# Patient Record
Sex: Female | Born: 1995 | Race: Black or African American | Hispanic: No | Marital: Single | Smoking: Former smoker
Health system: Southern US, Community
[De-identification: ages and names within clinical notes are randomized; demographics above are authoritative.]

## PROBLEM LIST (undated history)

## (undated) DIAGNOSIS — R809 Proteinuria, unspecified: Secondary | ICD-10-CM

## (undated) HISTORY — DX: Proteinuria, unspecified: R80.9

---
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
## 2014-02-16 ENCOUNTER — Telehealth: Payer: Self-pay

## 2014-02-16 NOTE — Telephone Encounter (Signed)
Patient not seen since 2013.  Pt needs physical.  Please advise.  Thanks!

## 2014-02-16 NOTE — Telephone Encounter (Signed)
If easier can do OV  First  Prefer 2 slots  visit

## 2014-02-16 NOTE — Telephone Encounter (Signed)
Pt called would like to discuss getting on birth control phone number [phone]

## 2014-02-18 NOTE — Telephone Encounter (Signed)
LM for pt to return call 02/18/14

## 2014-02-18 NOTE — Telephone Encounter (Signed)
Pt scheduled 3/30 at 11 am per md request.

## 2014-02-23 ENCOUNTER — Ambulatory Visit (INDEPENDENT_AMBULATORY_CARE_PROVIDER_SITE_OTHER): Payer: BC Managed Care – PPO | Admitting: Internal Medicine

## 2014-02-23 ENCOUNTER — Encounter: Payer: Self-pay | Admitting: Internal Medicine

## 2014-02-23 VITALS — BP 118/70 | Temp 99.0°F | Ht 68.25 in | Wt 155.0 lb

## 2014-02-23 DIAGNOSIS — Z3009 Encounter for other general counseling and advice on contraception: Secondary | ICD-10-CM

## 2014-02-23 DIAGNOSIS — Z30013 Encounter for initial prescription of injectable contraceptive: Secondary | ICD-10-CM

## 2014-02-23 DIAGNOSIS — Z113 Encounter for screening for infections with a predominantly sexual mode of transmission: Secondary | ICD-10-CM

## 2014-02-23 DIAGNOSIS — N926 Irregular menstruation, unspecified: Secondary | ICD-10-CM

## 2014-02-23 NOTE — Patient Instructions (Signed)
Call when have next period to come in for depo injection.( nursing visit injection only)  Urine screen today.   lifestyle intervention healthy eating and exercise . 150 minutes of exercise weeks  , . Avoid trans fats and processed foods;  Increase fresh fruits and veges to 5 servings per day. And avoid sweet beverages  Including tea and juice. Remain tobacco free.

## 2014-02-23 NOTE — Progress Notes (Signed)
Pre visit review using our clinic review tool, if applicable. No additional management support is needed unless otherwise documented below in the visit note.   Chief Complaint  Patient presents with  . Contraception    Would like to start bc for the first time.    HPI: Patient comes in today interested in contraception counseling and implementation. She is generally been well using condoms periods ocass irreg.  12 or more. A year interested in Depo-Provera because doesn't think she's going to be able to remember to take combined OCPs. No unusual vaginal symptoms or gynecologic symptoms. No tobacco  Neg asthma. Or significant migraines. GM had breast cancer older.  Negative family history of blood clots One partner the last 6 months has had HIV testing screening negative ROS: See pertinent positives and negatives per HPI. No chest pain shortness of breath asthma coughing major changes in vision hearing  Past Medical History  Diagnosis Date  . Proteinuria 2001    by DS. f/u 24 hr urine WNL    Family History  Problem Relation Age of Onset  . Hypertension Brother     History   Social History  . Marital Status: Married    Spouse Name: N/A    Number of Children: N/A  . Years of Education: N/A   Social History Main Topics  . Smoking status: Never Smoker   . Smokeless tobacco: None  . Alcohol Use: No  . Drug Use: No  . Sexual Activity:    Other Topics Concern  . None   Social History Narrative   Parents are divorced   [employer]   Average grades    Father in [employer]   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   Living with brother his girlfriend and 2 young children works weekends                   Outpatient Encounter Prescriptions as of 02/23/2014  Medication Sig  . [DISCONTINUED] diphenhydrAMINE (BENADRYL ALLERGY) 25 mg capsule Take 25 mg by mouth every 6 (six) hours as needed.    EXAM:  BP 118/70  Temp(Src) 99 F (37.2 C) (Oral)  Ht 5' 8.25" (1.734  m)  Wt 155 lb (70.308 kg)  BMI 23.38 kg/m2  LMP 02/14/2014  Body mass index is 23.38 kg/(m^2).  GENERAL: vitals reviewed and listed above, alert, oriented, appears well hydrated and in no acute distress HEENT: atraumatic, conjunctiva  clear, no obvious abnormalities on inspection of external nose and ears EMS clear OP : no lesion edema or exudate  NECK: no obvious masses on inspection palpation  Breast exam no masses or discharge bilateral pierced nipples LUNGS: clear to auscultation bilaterally, no wheezes, rales or rhonchi, good air movement CV: HRRR, no clubbing cyanosis or  peripheral edema nl cap refill  Abdomen:  Sof,t normal bowel sounds without hepatosplenomegaly, no guarding rebound or masses no CVA tenderness MS: moves all extremities without noticeable focal  abnormality PSYCH: pleasant and cooperative, no obvious depression or anxiety  ASSESSMENT AND PLAN:  Discussed the following assessment and plan:  Irregular menstrual cycle  Encounter for initial prescription of injectable contraceptive - option sdisc acceptable candidate   Screening for STD (sexually transmitted disease) - Plan: GC/chlamydia probe amp, urine  -Patient advised to return or notify health care team  if symptoms worsen ,persist or new concerns arise.  Patient Instructions  Call when have next period to come in for depo injection.(  nursing visit injection only)  Urine screen today.   lifestyle intervention healthy eating and exercise . 150 minutes of exercise weeks  , . Avoid trans fats and processed foods;  Increase fresh fruits and veges to 5 servings per day. And avoid sweet beverages  Including tea and juice. Remain tobacco free.      [name]. Panosh M.D.  Total visit 25mins > 50% spent counseling and coordinating care

## 2014-02-24 LAB — GC/CHLAMYDIA PROBE AMP, URINE
CHLAMYDIA, SWAB/URINE, PCR: POSITIVE — AB
GC PROBE AMP, URINE: NEGATIVE

## 2014-03-02 ENCOUNTER — Other Ambulatory Visit: Payer: Self-pay | Admitting: Family Medicine

## 2014-03-02 ENCOUNTER — Telehealth: Payer: Self-pay | Admitting: Family Medicine

## 2014-03-02 MED ORDER — AZITHROMYCIN 500 MG PO TABS: 1000.0000 mg | ORAL_TABLET | Freq: Every day | ORAL | Status: DC

## 2014-03-02 NOTE — Telephone Encounter (Signed)
Lab work closed out.  Called the pt and gave her the results of lab work from 02/23/14.  Medication sent to the pharmacy and she made future lab appt on 03/04/14. Instructed to call back if any further questions.

## 2014-03-04 ENCOUNTER — Other Ambulatory Visit: Payer: BC Managed Care – PPO

## 2014-03-11 ENCOUNTER — Other Ambulatory Visit (INDEPENDENT_AMBULATORY_CARE_PROVIDER_SITE_OTHER): Payer: BC Managed Care – PPO

## 2014-03-11 DIAGNOSIS — Z113 Encounter for screening for infections with a predominantly sexual mode of transmission: Secondary | ICD-10-CM

## 2014-03-11 LAB — CBC WITH DIFFERENTIAL/PLATELET
BASOS ABS: 0 10*3/uL (ref 0.0–0.1)
BASOS PCT: 0.7 % (ref 0.0–3.0)
EOS ABS: 0.1 10*3/uL (ref 0.0–0.7)
Eosinophils Relative: 1.2 % (ref 0.0–5.0)
HCT: 34.9 % — ABNORMAL LOW (ref 36.0–46.0)
Hemoglobin: 11.4 g/dL — ABNORMAL LOW (ref 12.0–15.0)
LYMPHS PCT: 38.8 % (ref 12.0–46.0)
Lymphs Abs: 2.6 10*3/uL (ref 0.7–4.0)
MCHC: 32.7 g/dL (ref 30.0–36.0)
MCV: 96.7 fl (ref 78.0–100.0)
Monocytes Absolute: 0.5 10*3/uL (ref 0.1–1.0)
Monocytes Relative: 6.9 % (ref 3.0–12.0)
NEUTROS PCT: 52.4 % (ref 43.0–77.0)
Neutro Abs: 3.6 10*3/uL (ref 1.4–7.7)
Platelets: 252 10*3/uL (ref 150.0–400.0)
RBC: 3.61 Mil/uL — AB (ref 3.87–5.11)
RDW: 12.9 % (ref 11.5–14.6)
WBC: 6.8 10*3/uL (ref 4.5–10.5)

## 2014-03-12 LAB — HIV ANTIBODY (ROUTINE TESTING W REFLEX): HIV 1&2 Ab, 4th Generation: NONREACTIVE

## 2014-03-12 LAB — RPR

## 2014-03-29 ENCOUNTER — Ambulatory Visit (INDEPENDENT_AMBULATORY_CARE_PROVIDER_SITE_OTHER): Payer: BC Managed Care – PPO | Admitting: Family Medicine

## 2014-03-29 DIAGNOSIS — Z309 Encounter for contraceptive management, unspecified: Secondary | ICD-10-CM

## 2014-03-29 LAB — POCT URINE PREGNANCY: PREG TEST UR: NEGATIVE

## 2014-03-29 MED ORDER — MEDROXYPROGESTERONE ACETATE 150 MG/ML IM SUSP
150.0000 mg | Freq: Once | INTRAMUSCULAR | Status: AC
  Administered 2014-03-29: 150 mg via INTRAMUSCULAR

## 2014-03-29 NOTE — Progress Notes (Deleted)
   Subjective:    Patient ID: Megan Rubio, female    DOB: 11/06/1996, 18 y.o.   MRN: [id number]  HPI    Review of Systems     Objective:   Physical Exam        Assessment & Plan:

## 2014-04-29 ENCOUNTER — Telehealth: Payer: Self-pay | Admitting: Internal Medicine

## 2014-04-29 NOTE — Telephone Encounter (Signed)
Noted  

## 2014-04-29 NOTE — Telephone Encounter (Signed)
Patient Information:  Caller Name: Assia  Phone: [phone]  Patient: Megan Rubio, Megan Rubio  Gender: Female  DOB: 06-14-1996  Age: 18 Years  PCP: Berniece Andreas [facility])  Pregnant: No  Office Follow Up:  Does the office need to follow up with this patient?: No  Instructions For The Office: N/A  RN Note:  LMP 04/21/14. First depoprovera shot was 03/29/14. Current flow is mild on cycle day #8. Plans to call back to schedule appointment after checks work schedule.  Symptoms  Reason For Call & Symptoms: Menses lasting longer than usual; so far 7 days.  Usual menses lasts 3-4 days.  Also noted menses blood is darker brown color. Mild menstrual cramps present.  Reviewed Health History In EMR: Yes  Reviewed Medications In EMR: Yes  Reviewed Allergies In EMR: Yes  Reviewed Surgeries / Procedures: Yes  Date of Onset of Symptoms: 04/28/2014 OB / GYN:  LMP: 04/21/2014  Guideline(s) Used:  Vaginal Bleeding - Abnormal  Disposition Per Guideline:   See Within 2 Weeks in Office  Reason For Disposition Reached:   Periods last > 7 days  Advice Given:  Pregnancy Test, When in Doubt:  If there is a chance that you might be pregnant, use a urine pregnancy test.  You can buy a pregnancy test at the drugstore.  It works best if you test your first urine in the morning.  Call back if you are pregnant  Iron and Anemia:   Heavy periods are the most common cause of iron deficiency anemia in women of childbearing age. Women with heavy periods should eat a diet rich in iron or take a daily multivitamin pill with iron.  Irregular bleeding and You are Using Implanon or DepoProvera:   Irregular bleeding is a common side effect. It may include heavier, lighter, more frequent, or less frequent bleeding than your normal periods.  Diary  : Keep a record of the days you have any bleeding or spotting.  Call Back If:  Irregular bleeding occurs more than 2 cycles (2 months)  Bleeding becomes worse  You  become worse.  RN Overrode Recommendation:  Follow Up With Office Later  Prefers to call back to schedule appointment after checks work schedule

## 2014-05-04 ENCOUNTER — Ambulatory Visit (INDEPENDENT_AMBULATORY_CARE_PROVIDER_SITE_OTHER): Payer: BC Managed Care – PPO | Admitting: Internal Medicine

## 2014-05-04 ENCOUNTER — Encounter: Payer: Self-pay | Admitting: Internal Medicine

## 2014-05-04 VITALS — BP 120/60 | Temp 99.4°F | Ht 68.25 in | Wt 154.0 lb

## 2014-05-04 DIAGNOSIS — N926 Irregular menstruation, unspecified: Secondary | ICD-10-CM

## 2014-05-04 DIAGNOSIS — N921 Excessive and frequent menstruation with irregular cycle: Secondary | ICD-10-CM

## 2014-05-04 DIAGNOSIS — N920 Excessive and frequent menstruation with regular cycle: Secondary | ICD-10-CM

## 2014-05-04 LAB — POCT URINE PREGNANCY: PREG TEST UR: NEGATIVE

## 2014-05-04 NOTE — Progress Notes (Signed)
Chief Complaint  Patient presents with  . Abnormal Bleeding    Pt started Depo Provera in May.    HPI: Patient comes in today for SDA for  problem evaluation. Concern about bleeding . normaly 12-2 periods per month last 3 days or so began first depo shot on  MAy 5th  Had chlamydia rx in early April on screen   Pos rx with azithro and neg blood test  ( partner was rx per pt) Since that time : Bleeding started MAy 26 light spotting and dark  And now more red and more.  Using pads now about 3-4 per day minor cramps.  Feels normal otherwise.  Uncertain if this is normal     No cp sob syncope other bleeding ROS: See pertinent positives and negatives per HPI.no  Past Medical History  Diagnosis Date  . Proteinuria 2001    by DS. f/u 24 hr urine WNL    Family History  Problem Relation Age of Onset  . Hypertension Brother     History   Social History  . Marital Status: Married    Spouse Name: N/A    Number of Children: N/A  . Years of Education: N/A   Social History Main Topics  . Smoking status: Never Smoker   . Smokeless tobacco: None  . Alcohol Use: No  . Drug Use: No  . Sexual Activity:    Other Topics Concern  . 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     Neg tad.   Living with brother his girlfriend and 2 young children works weekends                   Outpatient Encounter Prescriptions as of 05/04/2014  Medication Sig  . [DISCONTINUED] azithromycin (ZITHROMAX) 500 MG tablet Take 2 tablets (1,000 mg total) by mouth daily.    EXAM:  BP 120/60  Temp(Src) 99.4 F (37.4 C) (Oral)  Ht 5' 8.25" (1.734 m)  Wt 154 lb (69.854 kg)  BMI 23.23 kg/m2  Body mass index is 23.23 kg/(m^2).  GENERAL: vitals reviewed and listed above, alert, oriented, appears well hydrated and in no acute distress HEENT: atraumatic, conjunctiva  clear, no obvious abnormalities on  inspection of external nose and ears NECK: no obvious masses on inspection palpation  LUNGS: clear to auscultation bilaterally, no wheezes, rales or rhonchi, good air movement CV: HRRR, no clubbing cyanosis or  peripheral edema nl cap refill  Abdomen:  Sof,t normal bowel sounds without hepatosplenomegaly, no guarding rebound or masses no CVA tenderness Skin: normal capillary refill ,turgor , color: No acute rashes ,petechiae or bruising MS: moves all extremities without noticeable focal  abnormality PSYCH: pleasant and cooperative, no obvious depression or anxiety  ASSESSMENT AND PLAN:  Discussed the following assessment and plan:  Irregular menstrual cycle - Plan: GC/chlamydia probe amp, urine, POCT urine pregnancy  Prolonged menstrual cycle - Plan: GC/chlamydia probe amp, urine, POCT urine pregnancy Hx of chlamydia on screen rx  Recheck today ( doubt) bleeding pattern prob from transition to depo  But follow  .   Expectant management. And return for alarm feature etc .  -Patient advised to return or notify health care team  if symptoms worsen ,persist or new concerns arise. Sign up for My Chart. Patient Instructions  This is probably transitional bleeding  from beginning the dep but would check urine for pregnancy( doubt ) and rescreen for chlamydia  To be sure no infection is causing the problem .  Track bleeding  And come back for next depo shot . Should get shorter and lighter bleeding with time.    Neta Mends. Demarus Latterell M.D.   Pre visit review using our clinic review tool, if applicable. No additional management support is needed unless otherwise documented below in the visit note.

## 2014-05-04 NOTE — Patient Instructions (Signed)
This is probably transitional bleeding from beginning the dep but would check urine for pregnancy( doubt ) and rescreen for chlamydia  To be sure no infection is causing the problem .  Track bleeding  And come back for next depo shot . Should get shorter and lighter bleeding with time.

## 2014-05-05 LAB — GC/CHLAMYDIA PROBE AMP, URINE
Chlamydia, Swab/Urine, PCR: NEGATIVE
GC Probe Amp, Urine: NEGATIVE

## 2014-06-14 ENCOUNTER — Ambulatory Visit (INDEPENDENT_AMBULATORY_CARE_PROVIDER_SITE_OTHER): Payer: BC Managed Care – PPO | Admitting: Family Medicine

## 2014-06-14 ENCOUNTER — Ambulatory Visit: Payer: BC Managed Care – PPO | Admitting: Family Medicine

## 2014-06-14 DIAGNOSIS — Z309 Encounter for contraceptive management, unspecified: Secondary | ICD-10-CM

## 2014-06-14 MED ORDER — MEDROXYPROGESTERONE ACETATE 150 MG/ML IM SUSP
150.0000 mg | Freq: Once | INTRAMUSCULAR | Status: AC
  Administered 2014-06-14: 150 mg via INTRAMUSCULAR

## 2014-08-18 ENCOUNTER — Telehealth: Payer: Self-pay | Admitting: Internal Medicine

## 2014-08-18 NOTE — Telephone Encounter (Signed)
Pt would  Like to switch to the pill. Pt will get her depo on Monday 9/28, but wants to know what she needs to do to make this happen?

## 2014-08-23 ENCOUNTER — Ambulatory Visit: Payer: BC Managed Care – PPO | Admitting: Family Medicine

## 2014-08-23 MED ORDER — NORETHINDRONE ACET-ETHINYL EST 1.5-30 MG-MCG PO TABS: 1.0000 | ORAL_TABLET | Freq: Every day | ORAL | Status: DC

## 2014-08-23 NOTE — Telephone Encounter (Signed)
Can begin ocp  Today  Instead of depo shot   May take 2-3 months to get a regular bleeding pattern. Loestrin 1/30  28 day pack 1 po qd refill x 5 . rov  After 3 packs  Track bleeding pattern

## 2014-08-23 NOTE — Telephone Encounter (Signed)
Pt given rx for bc pills.  Notified to come back to the office for follow up in 3 months.  Will call back if any problems or unable to establish a regular bleeding pattern.

## 2014-09-25 ENCOUNTER — Ambulatory Visit (INDEPENDENT_AMBULATORY_CARE_PROVIDER_SITE_OTHER): Payer: BC Managed Care – PPO | Admitting: Family Medicine

## 2014-09-25 ENCOUNTER — Encounter: Payer: Self-pay | Admitting: Family Medicine

## 2014-09-25 VITALS — BP 108/80 | HR 72 | Temp 98.9°F | Ht 68.25 in | Wt 142.1 lb

## 2014-09-25 DIAGNOSIS — J020 Streptococcal pharyngitis: Secondary | ICD-10-CM

## 2014-09-25 DIAGNOSIS — J028 Acute pharyngitis due to other specified organisms: Secondary | ICD-10-CM

## 2014-09-25 LAB — POCT RAPID STREP A (OFFICE): Rapid Strep A Screen: NEGATIVE

## 2014-09-25 NOTE — Progress Notes (Signed)
Patient ID: Megan Rubio Gammage, female   DOB: 05/09/1996, 18 y.o.   MRN: [id number] Megan Rubio Bigos [id number] 01/06/1996 09/25/2014      Progress Note-Follow Up  Subjective  Chief Complaint  Chief Complaint  Patient presents with  . Sore Throat    possible strep X 2 days- starting to feel better today  . Headache    X 2 days    HPI  Patient is a 18 year old female in today for routine medical care. 2 day history of sore throat, responding to chloraseptic but then improved. Some llow grade fevers, mild headache and malaise noted. Denies CP/palp/SOB/congestion/GI or GU c/o. Taking meds as prescribed  Past Medical History  Diagnosis Date  . Proteinuria 2001    by DS. f/u 24 hr urine WNL    History reviewed. No pertinent past surgical history.  Family History  Problem Relation Age of Onset  . Hypertension Brother     History   Social History  . Marital Status: Married    Spouse Name: N/A    Number of Children: N/A  . Years of Education: N/A   Occupational History  . Not on file.   Social History Main Topics  . Smoking status: Never Smoker   . Smokeless tobacco: Not on file  . Alcohol Use: No  . Drug Use: No  . Sexual Activity:    Other Topics Concern  . 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   Living with brother his girlfriend and 2 young children works weekends                   Current Outpatient Prescriptions on File Prior to Visit  Medication Sig Dispense Refill  . Norethindrone Acetate-Ethinyl Estradiol (JUNEL,LOESTRIN,MICROGESTIN) 1.5-30 MG-MCG tablet Take 1 tablet by mouth daily.  1 Package  5   No current facility-administered medications on file prior to visit.    Allergies  Allergen Reactions  . Sulfa Antibiotics     Per pt SOB and dizziness     Review of Systems  Review of Systems  Constitutional: Positive for  fever. Negative for malaise/fatigue.  HENT: Positive for sore throat. Negative for congestion.   Eyes: Negative for discharge.  Respiratory: Negative for shortness of breath.   Cardiovascular: Negative for chest pain, palpitations and leg swelling.  Gastrointestinal: Negative for nausea, abdominal pain and diarrhea.  Genitourinary: Negative for dysuria.  Musculoskeletal: Positive for myalgias.  Skin: Negative for rash.  Neurological: Positive for headaches. Negative for loss of consciousness.  Endo/Heme/Allergies: Negative for polydipsia.    Objective  BP 108/80  Pulse 72  Temp(Src) 98.9 F (37.2 C) (Oral)  Ht 5' 8.25" (1.734 Rubio)  Wt 142 lb 1.9 oz (64.465 kg)  BMI 21.44 kg/m2  SpO2 99%  Physical Exam  Physical Exam  Constitutional: She is oriented to person, place, and time and well-developed, well-nourished, and in no distress. No distress.  HENT:  Head: Normocephalic and atraumatic.  2+ tonsil b/l. Erythema with white patch on left.   Eyes: Conjunctivae are normal.  Neck: Neck supple. No thyromegaly present.  Cardiovascular: Normal rate, regular rhythm and normal heart sounds.   No murmur heard. Pulmonary/Chest: Effort normal and breath sounds normal. She has no wheezes.  Abdominal: She exhibits no distension and  no mass.  Musculoskeletal: She exhibits no edema.  Lymphadenopathy:    She has cervical adenopathy.  Neurological: She is alert and oriented to person, place, and time.  Skin: Skin is warm and dry. No rash noted. She is not diaphoretic.  Psychiatric: Memory, affect and judgment normal.    Lab Results  Component Value Date   TSH 0.42 10/27/2010   Lab Results  Component Value Date   WBC 6.8 03/11/2014   HGB 11.4* 03/11/2014   HCT 34.9* 03/11/2014   MCV 96.7 03/11/2014   PLT 252.0 03/11/2014   Lab Results  Component Value Date   CREATININE 0.6 10/27/2010   BUN 10 10/27/2010   NA 138 10/27/2010   K 4.2 10/27/2010   CL 105 10/27/2010   CO2 24 10/27/2010    Lab Results  Component Value Date   ALT 10 10/27/2010   AST 21 10/27/2010   ALKPHOS 116 10/27/2010   BILITOT 1.1 10/27/2010      Assessment & Plan  Pharyngitis, acute Rapid strep negative. Treat viral symptoms Encouraged increased rest and hydration, add probiotics, zinc such as Coldeze or Xicam. Treat fevers as needed and call if worsens

## 2014-09-25 NOTE — Patient Instructions (Signed)
Encouraged increased rest and hydration, add probiotics such as Digestive Advantage or Vear Clock[employer]illips colon health, zinc such as Coldeze or Xicam. Treat fevers as needed Call if worsens  Pharyngitis Pharyngitis is redness, pain, and swelling (inflammation) of your pharynx.  CAUSES   Pharyngitis is usually caused by infection. Most of the time, these infections are from viruses (viral) and are part of a cold. However, sometimes pharyngitis is caused by bacteria (bacterial). Pharyngitis can also be caused by allergies. Viral pharyngitis may be spread from person to person by coughing, sneezing, and personal items or utensils (cups, forks, spoons, toothbrushes). Bacterial pharyngitis may be spread from person to person by more intimate contact, such as kissing.  SIGNS AND SYMPTOMS  Symptoms of pharyngitis include:   Sore throat.   Tiredness (fatigue).   Low-grade fever.   Headache.  Joint pain and muscle aches.  Skin rashes.  Swollen lymph nodes.  Plaque-like film on throat or tonsils (often seen with bacterial pharyngitis). DIAGNOSIS  Your health care provider will ask you questions about your illness and your symptoms. Your medical history, along with a physical exam, is often all that is needed to diagnose pharyngitis. Sometimes, a rapid strep test is done. Other lab tests may also be done, depending on the suspected cause.  TREATMENT  Viral pharyngitis will usually get better in 3-4 days without the use of medicine. Bacterial pharyngitis is treated with medicines that kill germs (antibiotics).  HOME CARE INSTRUCTIONS   Drink enough water and fluids to keep your urine clear or pale yellow.   Only take over-the-counter or prescription medicines as directed by your health care provider:   If you are prescribed antibiotics, make sure you finish them even if you start to feel better.   Do not take aspirin.   Get lots of rest.   Gargle with 8 oz of salt water ( tsp of salt per  1 qt of water) as often as every 1-2 hours to soothe your throat.   Throat lozenges (if you are not at risk for choking) or sprays may be used to soothe your throat. SEEK MEDICAL CARE IF:   You have large, tender lumps in your neck.  You have a rash.  You cough up green, yellow-brown, or bloody spit. SEEK IMMEDIATE MEDICAL CARE IF:   Your neck becomes stiff.  You drool or are unable to swallow liquids.  You vomit or are unable to keep medicines or liquids down.  You have severe pain that does not go away with the use of recommended medicines.  You have trouble breathing (not caused by a stuffy nose). MAKE SURE YOU:   Understand these instructions.  Will watch your condition.  Will get help right away if you are not doing well or get worse. Document Released: 11/12/2005 Document Revised: 09/02/2013 Document Reviewed: 07/20/2013 [facility] Patient Information 2015 [place], [place]. This information is not intended to replace advice given to you by your health care provider. Make sure you discuss any questions you have with your health care provider.

## 2014-09-25 NOTE — Assessment & Plan Note (Signed)
Rapid strep negative. Treat viral symptoms Encouraged increased rest and hydration, add probiotics, zinc such as Coldeze or Xicam. Treat fevers as needed and call if worsens

## 2014-10-31 ENCOUNTER — Emergency Department (HOSPITAL_COMMUNITY): Payer: BC Managed Care – PPO

## 2014-10-31 ENCOUNTER — Encounter (HOSPITAL_COMMUNITY): Payer: Self-pay | Admitting: Emergency Medicine

## 2014-10-31 ENCOUNTER — Emergency Department (HOSPITAL_COMMUNITY)
Admission: EM | Admit: 2014-10-31 | Discharge: 2014-10-31 | Disposition: A | Discharge status: Home / Self Care | Payer: BC Managed Care – PPO | Attending: Emergency Medicine | Admitting: Emergency Medicine

## 2014-10-31 DIAGNOSIS — R42 Dizziness and giddiness: Secondary | ICD-10-CM | POA: Insufficient documentation

## 2014-10-31 DIAGNOSIS — Z3202 Encounter for pregnancy test, result negative: Secondary | ICD-10-CM | POA: Diagnosis not present

## 2014-10-31 DIAGNOSIS — R51 Headache: Secondary | ICD-10-CM | POA: Diagnosis present

## 2014-10-31 DIAGNOSIS — Z79899 Other long term (current) drug therapy: Secondary | ICD-10-CM | POA: Insufficient documentation

## 2014-10-31 DIAGNOSIS — R519 Headache, unspecified: Secondary | ICD-10-CM

## 2014-10-31 DIAGNOSIS — M546 Pain in thoracic spine: Secondary | ICD-10-CM | POA: Insufficient documentation

## 2014-10-31 LAB — URINALYSIS, ROUTINE W REFLEX MICROSCOPIC
Bilirubin Urine: NEGATIVE
GLUCOSE, UA: NEGATIVE mg/dL
HGB URINE DIPSTICK: NEGATIVE
Ketones, ur: NEGATIVE mg/dL
Leukocytes, UA: NEGATIVE
Nitrite: NEGATIVE
Protein, ur: NEGATIVE mg/dL
Specific Gravity, Urine: 1.007 (ref 1.005–1.030)
Urobilinogen, UA: 1 mg/dL (ref 0.0–1.0)
pH: 6.5 (ref 5.0–8.0)

## 2014-10-31 LAB — POC URINE PREG, ED: Preg Test, Ur: NEGATIVE

## 2014-10-31 MED ORDER — DIPHENHYDRAMINE HCL 50 MG/ML IJ SOLN
25.0000 mg | Freq: Once | INTRAMUSCULAR | Status: AC
  Administered 2014-10-31: 25 mg via INTRAVENOUS
  Filled 2014-10-31: qty 1

## 2014-10-31 MED ORDER — DEXAMETHASONE SODIUM PHOSPHATE 10 MG/ML IJ SOLN
10.0000 mg | Freq: Once | INTRAMUSCULAR | Status: AC
  Administered 2014-10-31: 10 mg via INTRAVENOUS
  Filled 2014-10-31: qty 1

## 2014-10-31 MED ORDER — SODIUM CHLORIDE 0.9 % IV BOLUS (SEPSIS)
1000.0000 mL | INTRAVENOUS | Status: AC
  Administered 2014-10-31: 1000 mL via INTRAVENOUS

## 2014-10-31 MED ORDER — METOCLOPRAMIDE HCL 5 MG/ML IJ SOLN
5.0000 mg | Freq: Once | INTRAMUSCULAR | Status: AC
  Administered 2014-10-31: 5 mg via INTRAVENOUS
  Filled 2014-10-31: qty 2

## 2014-10-31 NOTE — ED Provider Notes (Signed)
CSN: [id number]     Arrival date & time 10/31/14  2142 History   First MD Initiated Contact with Patient 10/31/14 2155     Chief Complaint  Patient presents with  . Headache  . Back Pain     (Consider location/radiation/quality/duration/timing/severity/associated sxs/prior Treatment) Patient is a 18 y.o. female presenting with headaches and back pain. The history is provided by the patient.  Headache Pain location:  L temporal Quality:  Dull Radiates to:  L neck Severity currently:  7/10 Onset quality:  Sudden Duration:  12 hours Timing:  Constant Progression:  Unchanged Chronicity:  New Similar to prior headaches: slightly different.   Context comment:  At rest Relieved by:  Nothing Worsened by:  Nothing tried Ineffective treatments:  None tried Associated symptoms: back pain and dizziness   Associated symptoms: no abdominal pain, no congestion, no cough, no diarrhea, no pain, no fatigue, no fever, no nausea, no neck pain and no vomiting   Back Pain Associated symptoms: headaches   Associated symptoms: no abdominal pain, no chest pain, no dysuria and no fever     Past Medical History  Diagnosis Date  . Proteinuria 2001    by DS. f/u 24 hr urine WNL   History reviewed. No pertinent past surgical history. Family History  Problem Relation Age of Onset  . Hypertension Brother    History  Substance Use Topics  . Smoking status: Never Smoker   . Smokeless tobacco: Not on file  . Alcohol Use: No   OB History    No data available     Review of Systems  Constitutional: Negative for fever and fatigue.  HENT: Negative for congestion and drooling.   Eyes: Negative for pain.  Respiratory: Negative for cough and shortness of breath.   Cardiovascular: Negative for chest pain.  Gastrointestinal: Negative for nausea, vomiting, abdominal pain and diarrhea.  Genitourinary: Negative for dysuria and hematuria.  Musculoskeletal: Positive for back pain. Negative for gait  problem and neck pain.  Skin: Negative for color change.  Neurological: Positive for dizziness and headaches.  Hematological: Negative for adenopathy.  Psychiatric/Behavioral: Negative for behavioral problems.  All other systems reviewed and are negative.     Allergies  Sulfa antibiotics  Home Medications   Prior to Admission medications   Medication Sig Start Date End Date Taking? Authorizing Provider  Norethindrone Acetate-Ethinyl Estradiol (JUNEL,LOESTRIN,MICROGESTIN) 1.5-30 MG-MCG tablet Take 1 tablet by mouth daily. 08/23/14   Madelin HeadingsWanda K Panosh, MD   BP 136/90 mmHg  Pulse 83  Temp(Src) 98.3 F (36.8 C) (Oral)  Resp 18  Ht 5\' 8"  (1.727 m)  Wt 130 lb (58.968 kg)  BMI 19.77 kg/m2  SpO2 100%  LMP 10/10/2014 Physical Exam  Constitutional: She is oriented to person, place, and time. She appears well-developed and well-nourished.  HENT:  Head: Normocephalic.  Mouth/Throat: No oropharyngeal exudate.  Eyes: Conjunctivae and EOM are normal. Pupils are equal, round, and reactive to light.  Neck: Normal range of motion. Neck supple.  Normal range of motion of the neck.  Mild midthoracic tenderness to palpation.  Cardiovascular: Normal rate, regular rhythm, normal heart sounds and intact distal pulses.  Exam reveals no gallop and no friction rub.   No murmur heard. Pulmonary/Chest: Effort normal and breath sounds normal. No respiratory distress. She has no wheezes.  Abdominal: Soft. Bowel sounds are normal. There is no tenderness. There is no rebound and no guarding.  Musculoskeletal: Normal range of motion. She exhibits no edema or tenderness.  Neurological:  She is alert and oriented to person, place, and time.  alert, oriented x3 speech: normal in context and clarity memory: intact grossly cranial nerves II-XII: intact motor strength: full proximally and distally no involuntary movements or tremors sensation: intact to light touch diffusely  cerebellar: finger-to-nose and  heel-to-shin intact gait: normal forwards and backwards  Skin: Skin is warm and dry.  Psychiatric: She has a normal mood and affect. Her behavior is normal.  Nursing note and vitals reviewed.   ED Course  Procedures (including critical care time) Labs Review Labs Reviewed  URINALYSIS, ROUTINE W REFLEX MICROSCOPIC  POC URINE PREG, ED    Imaging Review Dg Thoracic Spine W/swimmers  10/31/2014   CLINICAL DATA:  Mid thoracic back pain and swelling, back pain worse with movement, symptoms began 1 week ago  EXAM: THORACIC SPINE - 2 VIEW + SWIMMERS  COMPARISON:  None  FINDINGS: Twelve pairs of ribs.  Broad based levoconvex thoracic scoliosis apex T6.  Vertebral body and disc space heights maintained.  Osseous mineralization grossly normal.  No acute fracture, subluxation or bone destruction.  Visualized posterior ribs unremarkable.  IMPRESSION: Broad-based levoconvex thoracic scoliosis.   Electronically Signed   By: Ulyses SouthwardMark  Boles M.D.   On: 10/31/2014 22:59     EKG Interpretation None      MDM   Final diagnoses:  Thoracic back pain  Headache, unspecified headache type    10:12 PM 18 y.o. female who presents with thoracic back pain which began approximately 1 week ago. She states that she developed a brisk onset left-sided headache which began this morning and has persisted throughout the day. Currently 7 out of 10. She denies any urinary symptoms. She is afebrile and vital signs are unremarkable here. She has a normal neurologic exam. We'll treat with headache cocktail and screening imaging of her back at request of the family.  11:20 PM: HA gone, pt continues to appear well.  I have discussed the diagnosis/risks/treatment options with the patient and family and believe the pt to be eligible for discharge home to follow-up with her pcp. We also discussed returning to the ED immediately if new or worsening sx occur. We discussed the sx which are most concerning (e.g., return of HA, fever,  worsening back pain) that necessitate immediate return. Medications administered to the patient during their visit and any new prescriptions provided to the patient are listed below.  Medications given during this visit Medications  sodium chloride 0.9 % bolus 1,000 mL (1,000 mLs Intravenous New Bag/Given 10/31/14 2229)  metoCLOPramide (REGLAN) injection 5 mg (5 mg Intravenous Given 10/31/14 2228)  diphenhydrAMINE (BENADRYL) injection 25 mg (25 mg Intravenous Given 10/31/14 2230)  dexamethasone (DECADRON) injection 10 mg (10 mg Intravenous Given 10/31/14 2229)    New Prescriptions   No medications on file     Purvis SheffieldForrest Rivan Siordia, MD 10/31/14 2335

## 2014-10-31 NOTE — ED Notes (Signed)
Pt reports noticing lower back pain and swelling while at work about a week ago, states she had swelling along the spine. Back pain worse with movement. Ambulatory with steady gait. Pt also c/o headache to L side of head onset yesterday along with dizziness/ lightheadedness. No blurred vision.

## 2015-01-28 ENCOUNTER — Telehealth: Payer: Self-pay | Admitting: Internal Medicine

## 2015-01-28 NOTE — Telephone Encounter (Signed)
Stopped taking the bc pills the end of November.  Her periods in Jan and February have been irregular.  Flow has been lighter than normal.  First day of her last period was Sunday (01-16-15) before last and ended Thursday (01/20/15) of last week.  Has an appt on 02/11/15 to see a gynecologist.  Advised that she should discuss this with her gynecologist but will check with Dr. Fabian SharpPanosh to see if this is normal while coming off bc pills.

## 2015-01-28 NOTE — Telephone Encounter (Signed)
Spoke to the pt.  Advised of the information below.  She will keep appt with gynecologist and will re tell all of this information.  Does not want a pregnancy test at this time.

## 2015-01-28 NOTE — Telephone Encounter (Signed)
irreg periods can happen any time and usually not  from the ocps   If not taking them .  ( can do preg tests to make sure not pregnant( otherwise   Keep appt with gynecologist.  And relate this information to them.

## 2015-01-28 NOTE — Telephone Encounter (Signed)
Pt has questions about her Norethindrone Acetate-Ethinyl Estradiol (JUNEL,LOESTRIN,MICROGESTIN) 1.5-30 MG-MCG tablet Would like a cb

## 2015-01-31 IMAGING — CR DG THORACIC SPINE 3V
3 series · 3 of 3 positions shown · non-contrast
Comparison: None

CLINICAL DATA: Mid thoracic back pain and swelling, back pain worse
with movement, symptoms began 1 week ago

EXAM:
THORACIC SPINE - 2 VIEW + SWIMMERS

[t thoracic spine ap]
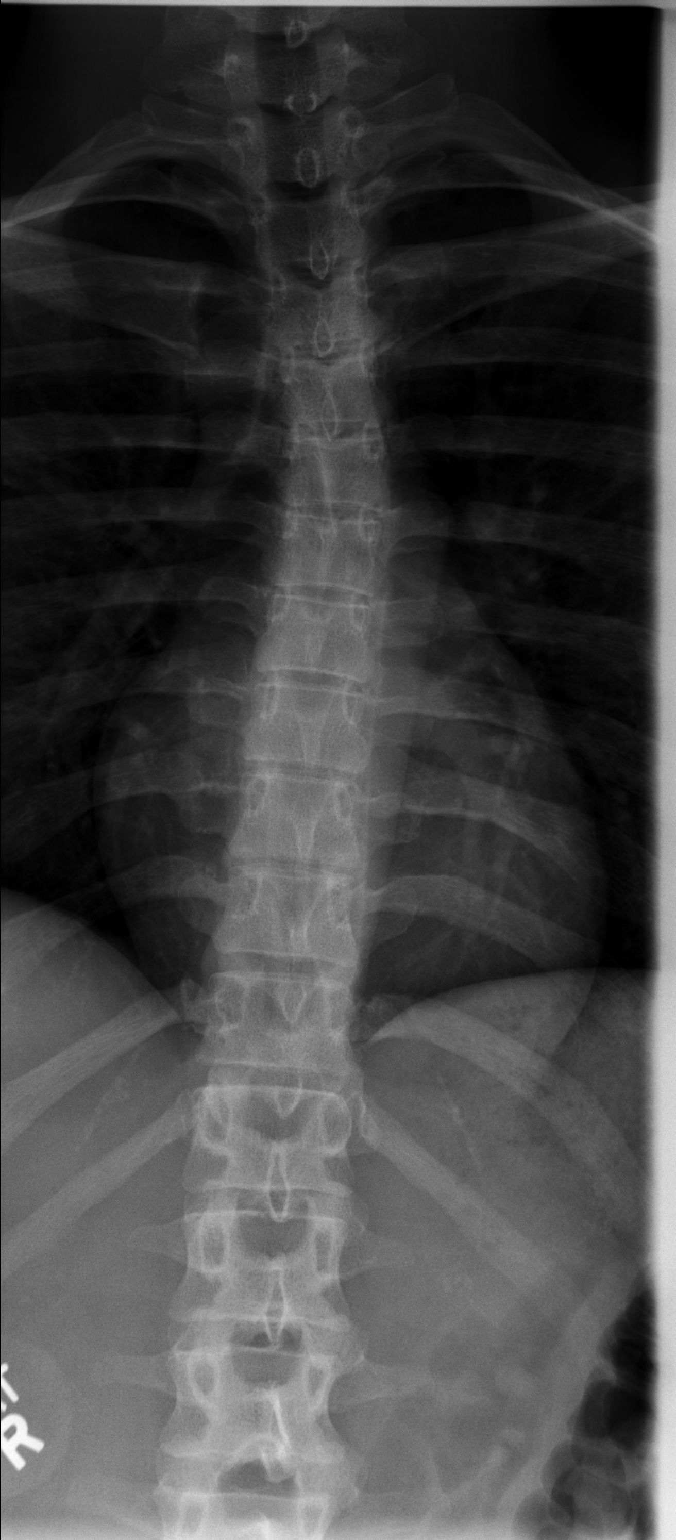

[t thoracic spine lat]
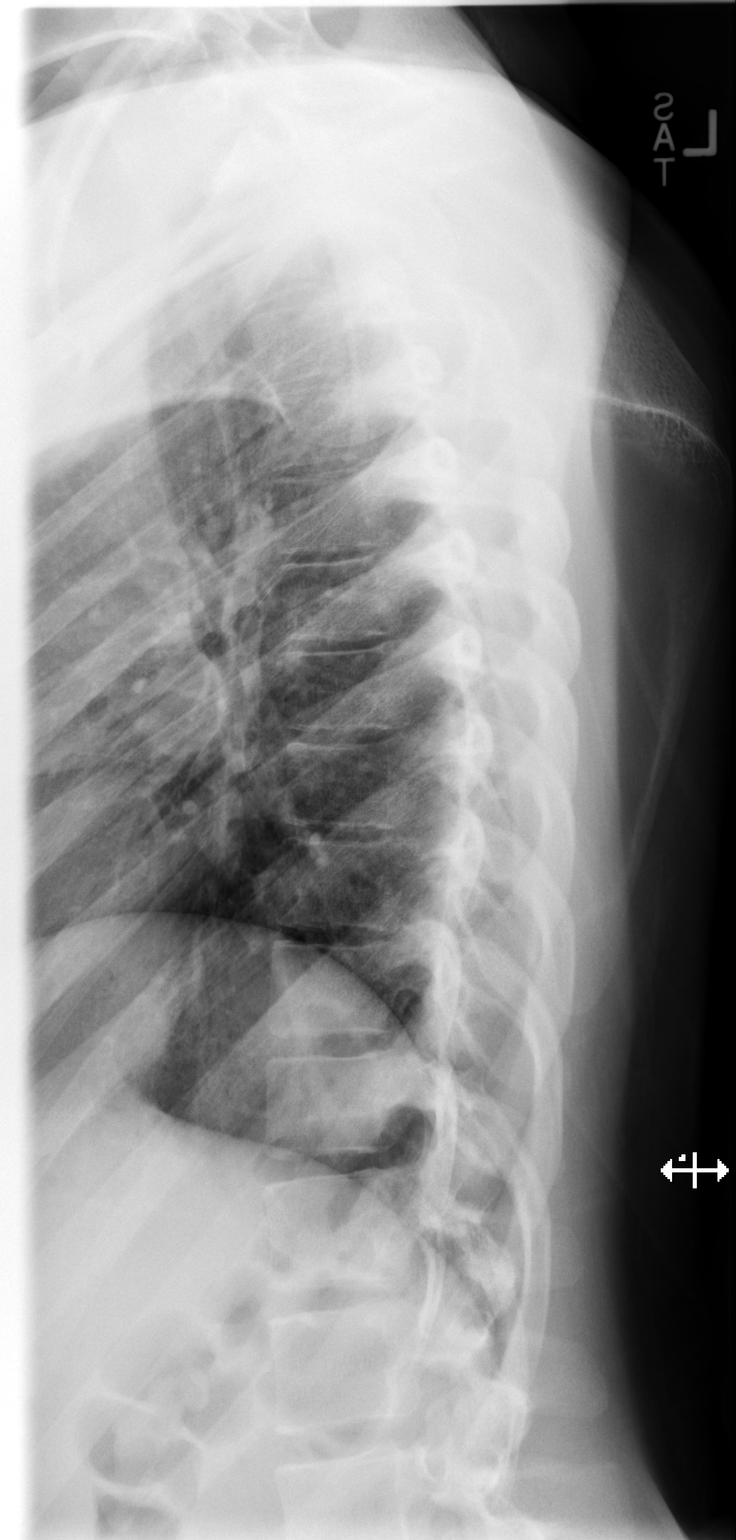

[t thoracic swimmers]
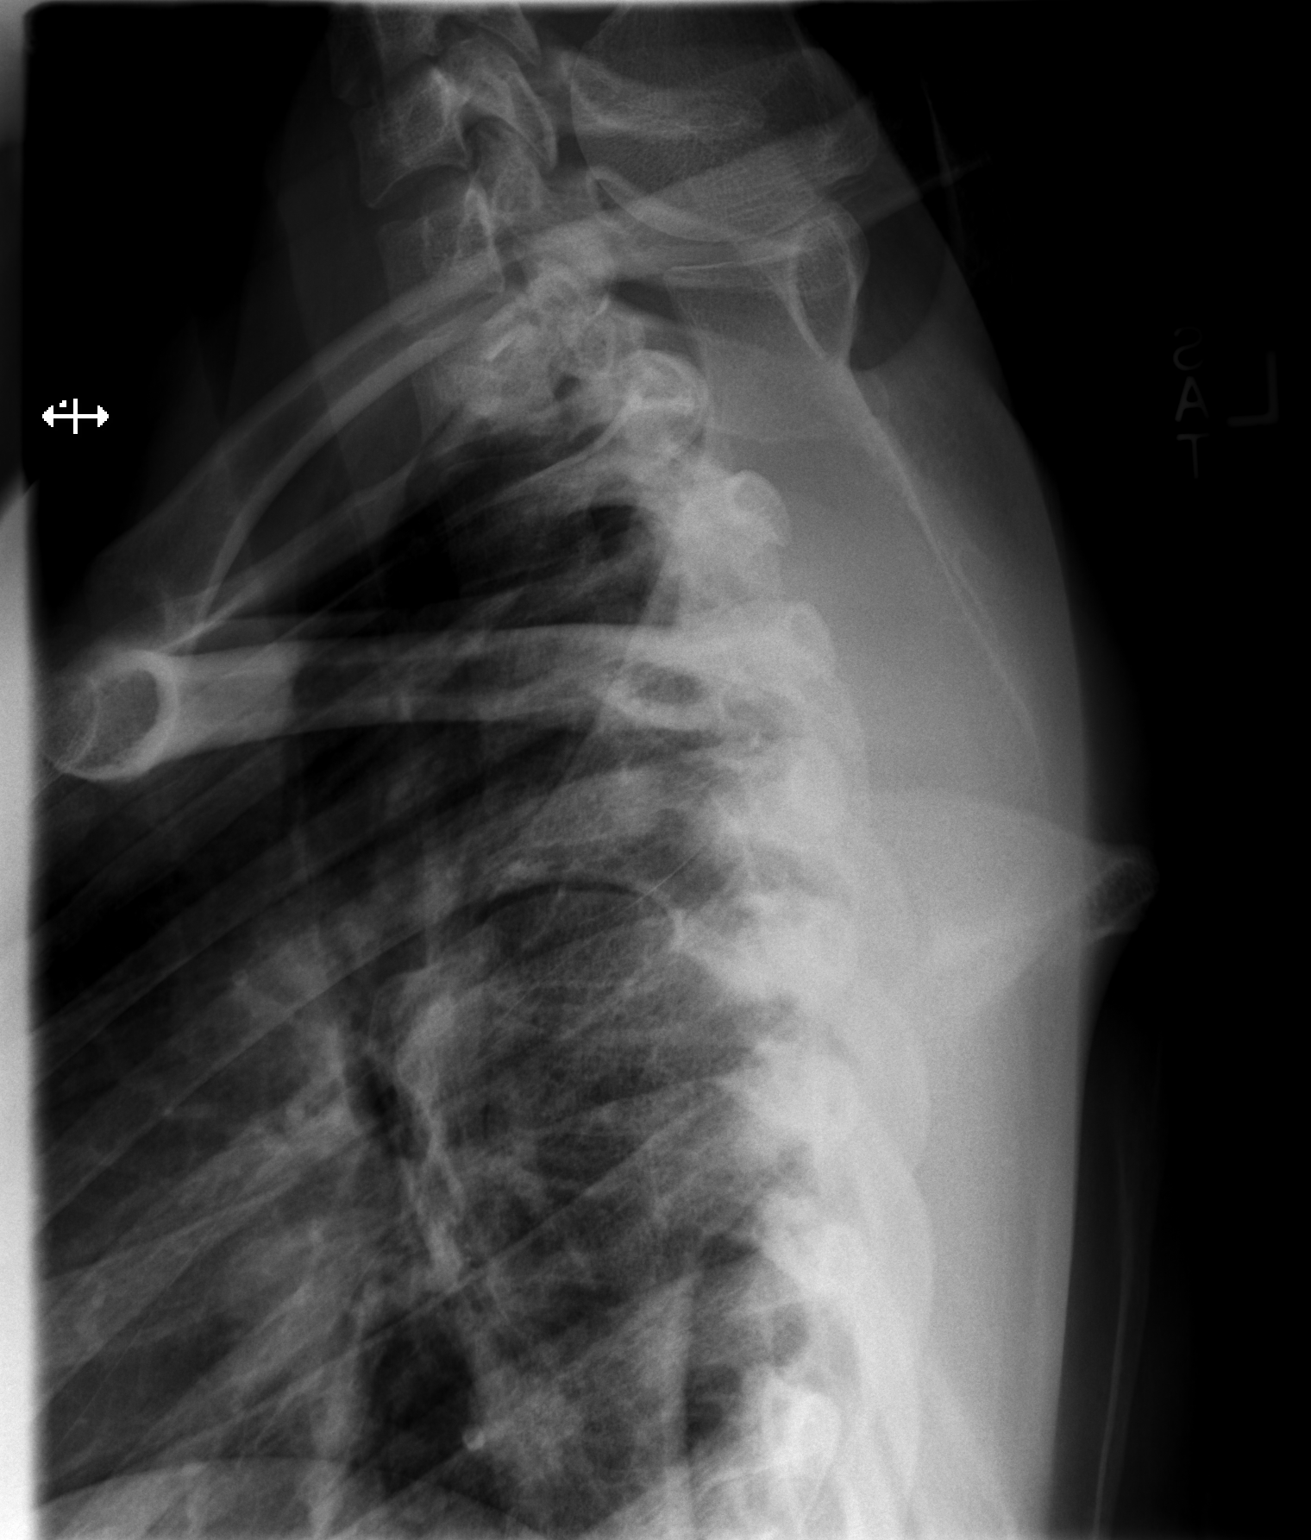

[3 of 3 positions shown; findings below may reference images not displayed]

FINDINGS: Twelve pairs of ribs.

Broad based levoconvex thoracic scoliosis apex T6.

Vertebral body and disc space heights maintained.

Osseous mineralization grossly normal.

No acute fracture, subluxation or bone destruction.

Visualized posterior ribs unremarkable.
IMPRESSION: Broad-based levoconvex thoracic scoliosis.

## 2015-06-16 ENCOUNTER — Telehealth: Payer: Self-pay | Admitting: Internal Medicine

## 2015-06-16 NOTE — Telephone Encounter (Signed)
[facility] - Client TELEPHONE ADVICE RECORD [facility] Patient Name: Megan Rubio DOB: [birth year] Initial Comment Caller states been having back pain Nurse Assessment Nurse: Roderic Ovens, RN, Amy Date/Time (Eastern Time): 06/16/2015 10:37:56 AM Confirm and document reason for call. If symptomatic, describe symptoms. ---CALLER STATES THAT SHE IS HAVING A FLARE UP WITH THE SCOLIOSIS THAT SHE WAS DIAGNOSED WITH LAST YEAR. SHE STATES SHE HAD XRAYS DONE AND SHE IS HAVING A HARD TIME WITH HER JOB STANDING ON HER FEET ALL THE TIME. IT FLARED UP YESTERDAY. PAIN SCALE 8-9 WITH MOVEMENT AND WITH NO MOVEMENT ITS A 4-5. Has the patient traveled out of the country within the last 30 days? ---Not Applicable Does the patient require triage? ---Yes Related visit to physician within the last 2 weeks? ---No Does the PT have any chronic conditions? (i.e. diabetes, asthma, etc.) ---Yes List chronic conditions. ---SCOLIOSIS Did the patient indicate they were pregnant? ---No Guidelines Guideline Title Affirmed Question Affirmed Notes Back Pain [1] SEVERE pain (excruciating) AND [2] not improved after 2 hours of pain medicine Final Disposition User See Physician within 4 Hours (or PCP triage) Roderic Ovens, RN, Amy Disagree/Comply: Comply

## 2015-06-17 ENCOUNTER — Encounter: Payer: Self-pay | Admitting: Internal Medicine

## 2015-06-17 NOTE — Progress Notes (Signed)
Document opened and reviewed for sda visit . No showed .

## 2015-08-23 ENCOUNTER — Other Ambulatory Visit: Payer: Self-pay | Admitting: Internal Medicine

## 2015-08-29 ENCOUNTER — Other Ambulatory Visit: Payer: Self-pay | Admitting: Internal Medicine

## 2016-01-05 ENCOUNTER — Other Ambulatory Visit: Payer: Self-pay | Admitting: Internal Medicine

## 2016-01-05 ENCOUNTER — Ambulatory Visit (INDEPENDENT_AMBULATORY_CARE_PROVIDER_SITE_OTHER): Payer: BLUE CROSS/BLUE SHIELD | Admitting: Internal Medicine

## 2016-01-05 ENCOUNTER — Encounter: Payer: Self-pay | Admitting: Internal Medicine

## 2016-01-05 VITALS — BP 120/60 | Temp 98.7°F | Wt 143.0 lb

## 2016-01-05 DIAGNOSIS — R110 Nausea: Secondary | ICD-10-CM | POA: Diagnosis not present

## 2016-01-05 DIAGNOSIS — Z30018 Encounter for initial prescription of other contraceptives: Secondary | ICD-10-CM | POA: Diagnosis not present

## 2016-01-05 DIAGNOSIS — N926 Irregular menstruation, unspecified: Secondary | ICD-10-CM

## 2016-01-05 DIAGNOSIS — Z113 Encounter for screening for infections with a predominantly sexual mode of transmission: Secondary | ICD-10-CM | POA: Diagnosis not present

## 2016-01-05 NOTE — Patient Instructions (Signed)
Limit evening meal or earlier   Try to [employer] mid day food  Protein avoid  Large simple carbs .   Urine screen today Make appt for first depo injection  when have period .  Then every 12 weeks. ( no ov needed if doing ok)

## 2016-01-05 NOTE — Progress Notes (Signed)
Pre visit review using our clinic review tool, if applicable. No additional management support is needed unless otherwise documented below in the visit note.  Chief Complaint  Patient presents with  . Follow-up    go back on depo     HPI: Megan Rubio 20 y.o. comes in for help with contraceptive management. Her new insurance no longer pays for OCPs and she also has a hard time remembering to take them on time. Because of her work and sleep schedule. Last SA about a month ago. Periods are regular but last 3-5 days and she thinks one is due in the next week or 2.  Also notes early a.m. nauseous a lot for years. He is not hungry in the morning so she eats later. No vomiting weight loss other systemic symptoms or abdominal pain. Nausea in am.  Eats 10 pm  Bed at 11 - 12  Later  Up at   10 latest.   Breakfast she was able to have a [employer] sandwich today. Occasional alcohol coffee in the morning. Working at [employer] . 5 days a week usually 01-04-09. ROS: See pertinent positives and negatives per HPI. No UTI symptoms  Past Medical History  Diagnosis Date  . Proteinuria 2001    by DS. f/u 24 hr urine WNL    Family History  Problem Relation Age of Onset  . Hypertension Brother     Social History   Social History  . Marital Status: Single    Spouse Name: N/A  . Number of Children: N/A  . Years of Education: N/A   Social History Main Topics  . Smoking status: Never Smoker   . Smokeless tobacco: None  . Alcohol Use: No  . Drug Use: No  . Sexual Activity: Not Asked   Other Topics Concern  . None   Social History Narrative   Parents are divorced   [employer]   Average grades    Father in [employer]   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   Living with brother his girlfriend and 2 young children works weekends                   Outpatient Prescriptions Prior to Visit  Medication Sig Dispense Refill  . acetaminophen (TYLENOL) 500 MG tablet Take 500 mg by  mouth every 6 (six) hours as needed for moderate pain.    . Norethindrone Acetate-Ethinyl Estradiol (JUNEL,LOESTRIN,MICROGESTIN) 1.5-30 MG-MCG tablet Take 1 tablet by mouth daily. 1 Package 5   No facility-administered medications prior to visit.     EXAM:  BP 120/60 mmHg  Temp(Src) 98.7 F (37.1 C) (Oral)  Wt 143 lb (64.864 kg)  Body mass index is 21.75 kg/(m^2).  GENERAL: vitals reviewed and listed above, alert, oriented, appears well hydrated and in no acute distress HEENT: atraumatic, conjunctiva  clear, no obvious abnormalities on inspection of external nose and ears NECK: no obvious masses on inspection palpation  LUNGS: clear to auscultation bilaterally, no wheezes, rales or rhonchi, good air movement CV: HRRR, no clubbing cyanosis or  peripheral edema nl cap refill  Abdomen soft without organomegaly guarding or rebound MS: moves all extremities without noticeable focal  abnormality PSYCH: pleasant and cooperative, no obvious depression or anxiety lmpabout 3 weeks ago  ASSESSMENT AND PLAN:  Discussed the following assessment and plan:  Irregular menstrual cycle  Encounter for initial prescription of other contraceptives  Screening for STD (sexually transmitted disease) -  Plan: GC/chlamydia probe amp, urine  Nausea without vomiting - long term in am no other sx  disc  change eating habits  fu if alarm sx  Declines HIV RPR you a GC chlamydia screen today no symptoms. Total visit > 50% spent counseling and coordinating care as indicated in above note and in instructions to patient .   -Patient advised to return or notify health care team  if symptoms worsen ,persist or new concerns arise.  Patient Instructions  Limit evening meal or earlier   Try to inc  Lunch mid day food  Protein avoid  Large simple carbs .   Urine screen today Make appt for first depo injection  when have period .  Then every 12 weeks. ( no ov needed if doing ok)    Burna Mortimer K. [name]  M.D.

## 2016-01-06 LAB — GC/CHLAMYDIA PROBE AMP
CT Probe RNA: NOT DETECTED
GC Probe RNA: NOT DETECTED

## 2016-01-26 ENCOUNTER — Ambulatory Visit (INDEPENDENT_AMBULATORY_CARE_PROVIDER_SITE_OTHER): Payer: BLUE CROSS/BLUE SHIELD | Admitting: Family Medicine

## 2016-01-26 DIAGNOSIS — Z30018 Encounter for initial prescription of other contraceptives: Secondary | ICD-10-CM | POA: Diagnosis not present

## 2016-01-26 MED ORDER — MEDROXYPROGESTERONE ACETATE 150 MG/ML IM SUSP
150.0000 mg | Freq: Once | INTRAMUSCULAR | Status: AC
  Administered 2016-01-26: 150 mg via INTRAMUSCULAR

## 2016-03-14 ENCOUNTER — Emergency Department (HOSPITAL_COMMUNITY)
Admission: EM | Admit: 2016-03-14 | Discharge: 2016-03-14 | Disposition: A | Discharge status: Home / Self Care | Payer: BLUE CROSS/BLUE SHIELD | Attending: Emergency Medicine | Admitting: Emergency Medicine

## 2016-03-14 ENCOUNTER — Encounter (HOSPITAL_COMMUNITY): Payer: Self-pay | Admitting: Emergency Medicine

## 2016-03-14 DIAGNOSIS — Y998 Other external cause status: Secondary | ICD-10-CM | POA: Diagnosis not present

## 2016-03-14 DIAGNOSIS — H9311 Tinnitus, right ear: Secondary | ICD-10-CM | POA: Diagnosis not present

## 2016-03-14 DIAGNOSIS — Y9289 Other specified places as the place of occurrence of the external cause: Secondary | ICD-10-CM | POA: Diagnosis not present

## 2016-03-14 DIAGNOSIS — Y9389 Activity, other specified: Secondary | ICD-10-CM | POA: Insufficient documentation

## 2016-03-14 DIAGNOSIS — R51 Headache: Secondary | ICD-10-CM | POA: Diagnosis present

## 2016-03-14 DIAGNOSIS — X58XXXA Exposure to other specified factors, initial encounter: Secondary | ICD-10-CM | POA: Insufficient documentation

## 2016-03-14 DIAGNOSIS — H53149 Visual discomfort, unspecified: Secondary | ICD-10-CM | POA: Insufficient documentation

## 2016-03-14 DIAGNOSIS — J320 Chronic maxillary sinusitis: Secondary | ICD-10-CM | POA: Diagnosis not present

## 2016-03-14 DIAGNOSIS — S161XXA Strain of muscle, fascia and tendon at neck level, initial encounter: Secondary | ICD-10-CM | POA: Insufficient documentation

## 2016-03-14 MED ORDER — METHOCARBAMOL 500 MG PO TABS
500.0000 mg | ORAL_TABLET | Freq: Once | ORAL | Status: AC
  Administered 2016-03-14: 500 mg via ORAL
  Filled 2016-03-14: qty 1

## 2016-03-14 MED ORDER — MOMETASONE FUROATE 50 MCG/ACT NA SUSP: 2.0000 | Freq: Every day | NASAL | Status: DC

## 2016-03-14 MED ORDER — METHOCARBAMOL 500 MG PO TABS: 500.0000 mg | ORAL_TABLET | Freq: Three times a day (TID) | ORAL | Status: DC | PRN

## 2016-03-14 MED ORDER — IBUPROFEN 600 MG PO TABS: 600.0000 mg | ORAL_TABLET | Freq: Four times a day (QID) | ORAL | Status: DC | PRN

## 2016-03-14 MED ORDER — LORATADINE 10 MG PO TABS: 10.0000 mg | ORAL_TABLET | Freq: Every day | ORAL | Status: DC

## 2016-03-14 MED ORDER — IBUPROFEN 200 MG PO TABS
600.0000 mg | ORAL_TABLET | Freq: Once | ORAL | Status: AC
  Administered 2016-03-14: 600 mg via ORAL
  Filled 2016-03-14: qty 3

## 2016-03-14 MED ORDER — OXYMETAZOLINE HCL 0.05 % NA SOLN: 1.0000 | Freq: Two times a day (BID) | NASAL | Status: DC

## 2016-03-14 MED ORDER — LORATADINE 10 MG PO TABS
10.0000 mg | ORAL_TABLET | Freq: Once | ORAL | Status: AC
  Administered 2016-03-14: 10 mg via ORAL
  Filled 2016-03-14: qty 1

## 2016-03-14 NOTE — ED Notes (Signed)
Right sided headache x 2 weeks with photophobia and worsening with loud noises. Pt states she is nauseous but believes it's from her birth control, also c/o neck pain.

## 2016-03-14 NOTE — Discharge Instructions (Signed)
Muscle Strain A muscle strain is an injury that occurs when a muscle is stretched beyond its normal length. Usually a small number of muscle fibers are torn when this happens. Muscle strain is rated in degrees. First-degree strains have the least amount of muscle fiber tearing and pain. Second-degree and third-degree strains have increasingly more tearing and pain.  Usually, recovery from muscle strain takes 1-2 weeks. Complete healing takes 5-6 weeks.  CAUSES  Muscle strain happens when a sudden, violent force placed on a muscle stretches it too far. This may occur with lifting, sports, or a fall.  RISK FACTORS Muscle strain is especially common in athletes.  SIGNS AND SYMPTOMS At the site of the muscle strain, there may be:  Pain.  Bruising.  Swelling.  Difficulty using the muscle due to pain or lack of normal function. DIAGNOSIS  Your health care provider will perform a physical exam and ask about your medical history. TREATMENT  Often, the best treatment for a muscle strain is resting, icing, and applying cold compresses to the injured area.  HOME CARE INSTRUCTIONS   Use the PRICE method of treatment to promote muscle healing during the first 2-3 days after your injury. The PRICE method involves:  Protecting the muscle from being injured again.  Restricting your activity and resting the injured body part.  Icing your injury. To do this, put ice in a plastic bag. Place a towel between your skin and the bag. Then, apply the ice and leave it on from 15-20 minutes each hour. After the third day, switch to moist heat packs.  Apply compression to the injured area with a splint or elastic bandage. Be careful not to wrap it too tightly. This may interfere with blood circulation or increase swelling.  Elevate the injured body part above the level of your heart as often as you can.  Only take over-the-counter or prescription medicines for pain, discomfort, or fever as directed by your  health care provider. WarminSinusitis, Adult Sinusitis is redness, soreness, and inflammation of the paranasal sinuses. Paranasal sinuses are air pockets within the bones of your face. They are located beneath your eyes, in the middle of your forehead, and above your eyes. In healthy paranasal sinuses, mucus is able to drain out, and air is able to circulate through them by way of your nose. However, when your paranasal sinuses are inflamed, mucus and air can become trapped. This can allow bacteria and other germs to grow and cause infection. Sinusitis can develop quickly and last only a short time (acute) or continue over a long period (chronic). Sinusitis that lasts for more than 12 weeks is considered chronic. CAUSES Causes of sinusitis include: Allergies. Structural abnormalities, such as displacement of the cartilage that separates your nostrils (deviated septum), which can decrease the air flow through your nose and sinuses and affect sinus drainage. Functional abnormalities, such as when the small hairs (cilia) that line your sinuses and help remove mucus do not work properly or are not present. SIGNS AND SYMPTOMS Symptoms of acute and chronic sinusitis are the same. The primary symptoms are pain and pressure around the affected sinuses. Other symptoms include: Upper toothache. Earache. Headache. Bad breath. Decreased sense of smell and taste. A cough, which worsens when you are lying flat. Fatigue. Fever. Thick drainage from your nose, which often is green and may contain pus (purulent). Swelling and warmth over the affected sinuses. DIAGNOSIS Your health care provider will perform a physical exam. During your exam, your health  care provider may perform any of the following to help determine if you have acute sinusitis or chronic sinusitis: Look in your nose for signs of abnormal growths in your nostrils (nasal polyps). Tap over the affected sinus to check for signs of  infection. View the inside of your sinuses using an imaging device that has a light attached (endoscope). If your health care provider suspects that you have chronic sinusitis, one or more of the following tests may be recommended: Allergy tests. Nasal culture. A sample of mucus is taken from your nose, sent to a lab, and screened for bacteria. Nasal cytology. A sample of mucus is taken from your nose and examined by your health care provider to determine if your sinusitis is related to an allergy. TREATMENT Most cases of acute sinusitis are related to a viral infection and will resolve on their own within 10 days. Sometimes, medicines are prescribed to help relieve symptoms of both acute and chronic sinusitis. These may include pain medicines, decongestants, nasal steroid sprays, or saline sprays. However, for sinusitis related to a bacterial infection, your health care provider will prescribe antibiotic medicines. These are medicines that will help kill the bacteria causing the infection. Rarely, sinusitis is caused by a fungal infection. In these cases, your health care provider will prescribe antifungal medicine. For some cases of chronic sinusitis, surgery is needed. Generally, these are cases in which sinusitis recurs more than 3 times per year, despite other treatments. HOME CARE INSTRUCTIONS Drink plenty of water. Water helps thin the mucus so your sinuses can drain more easily. Use a humidifier. Inhale steam 3-4 times a day (for example, sit in the bathroom with the shower running). Apply a warm, moist washcloth to your face 3-4 times a day, or as directed by your health care provider. Use saline nasal sprays to help moisten and clean your sinuses. Take medicines only as directed by your health care provider. If you were prescribed either an antibiotic or antifungal medicine, finish it all even if you start to feel better. SEEK IMMEDIATE MEDICAL CARE IF: You have increasing pain or  severe headaches. You have nausea, vomiting, or drowsiness. You have swelling around your face. You have vision problems. You have a stiff neck. You have difficulty breathing.   This information is not intended to replace advice given to you by your health care provider. Make sure you discuss any questions you have with your health care provider.   Document Released: 11/12/2005 Document Revised: 12/03/2014 Document Reviewed: 11/27/2011 Elsevier Interactive Patient Education 2016 Elsevier Inc.  g up prior to exercise helps to prevent future muscle strains. SEEK MEDICAL CARE IF:   You have increasing pain or swelling in the injured area.  You have numbness, tingling, or a significant loss of strength in the injured area. MAKE SURE YOU:   Understand these instructions.  Will watch your condition.  Will get help right away if you are not doing well or get worse.   This information is not intended to replace advice given to you by your health care provider. Make sure you discuss any questions you have with your health care provider.   Document Released: 11/12/2005 Document Revised: 09/02/2013 Document Reviewed: 06/11/2013 Elsevier Interactive Patient Education [employer]

## 2016-03-14 NOTE — ED Provider Notes (Signed)
CSN: [id number]     Arrival date & time 03/14/16  1459 History   First MD Initiated Contact with Patient 03/14/16 1531     Chief Complaint  Patient presents with  . Headache     (Consider location/radiation/quality/duration/timing/severity/associated sxs/prior Treatment) HPI Patient presents with gradual onset right-sided headache for the past 2 weeks. She states it seems to radiate from the right side of her neck into this posterior scalp. She also also having right-sided facial pain with some nasal congestion. Patient does have some occasional tinnitus in the right ear. No known trauma. No fever or chills. No neck rigidity or limitations in range of motion. No weakness or numbness. Patient's had some photophobia but no other visual changes. Past Medical History  Diagnosis Date  . Proteinuria 2001    by DS. f/u 24 hr urine WNL   History reviewed. No pertinent past surgical history. Family History  Problem Relation Age of Onset  . Hypertension Brother    Social History  Substance Use Topics  . Smoking status: Never Smoker   . Smokeless tobacco: None  . Alcohol Use: No   OB History    No data available     Review of Systems  Constitutional: Negative for fever, chills and fatigue.  HENT: Positive for congestion and sinus pressure. Negative for sore throat.   Eyes: Positive for photophobia. Negative for pain and visual disturbance.  Respiratory: Negative for cough and shortness of breath.   Cardiovascular: Negative for chest pain.  Gastrointestinal: Negative for nausea, vomiting and abdominal pain.  Musculoskeletal: Positive for myalgias and neck pain. Negative for back pain and neck stiffness.  Skin: Negative for rash and wound.  Neurological: Positive for headaches. Negative for dizziness, syncope, weakness, light-headedness and numbness.  All other systems reviewed and are negative.     Allergies  Sulfa antibiotics  Home Medications   Prior to Admission medications    Medication Sig Start Date End Date Taking? Authorizing Provider  acetaminophen (TYLENOL) 500 MG tablet Take 1,000 mg by mouth every 6 (six) hours as needed for moderate pain or headache.   Yes Historical Provider, MD  medroxyPROGESTERone (DEPO-PROVERA) 150 MG/ML injection Inject 150 mg into the muscle every 3 (three) months.   Yes Historical Provider, MD  ibuprofen (ADVIL,MOTRIN) 600 MG tablet Take 1 tablet (600 mg total) by mouth every 6 (six) hours as needed. 03/14/16   Loren Raceravid Howard Patton, MD  loratadine (CLARITIN) 10 MG tablet Take 1 tablet (10 mg total) by mouth daily. 03/14/16   Loren Raceravid Madelynn Malson, MD  methocarbamol (ROBAXIN) 500 MG tablet Take 1 tablet (500 mg total) by mouth every 8 (eight) hours as needed for muscle spasms. 03/14/16   Loren Raceravid Rajveer Handler, MD  mometasone (NASONEX) 50 MCG/ACT nasal spray Place 2 sprays into the nose daily. 03/14/16   Loren Raceravid Breniyah Romm, MD  oxymetazoline (AFRIN NASAL SPRAY) 0.05 % nasal spray Place 1 spray into both nostrils 2 (two) times daily. 03/14/16   Loren Raceravid Tedra Coppernoll, MD   BP 129/82 mmHg  Pulse 82  Temp(Src) 98.4 F (36.9 C) (Oral)  Resp 18  SpO2 100% Physical Exam  Constitutional: She is oriented to person, place, and time. She appears well-developed and well-nourished. No distress.  HENT:  Head: Normocephalic and atraumatic.  Mouth/Throat: Oropharynx is clear and moist. No oropharyngeal exudate.  Bilateral nasal mucosal edema. Fullness of the right TM. Tenderness to percussion the right maxillary sinus.  Eyes: EOM are normal. Pupils are equal, round, and reactive to light.  Neck: Normal range  of motion. Neck supple.  No meningismus. No posterior midline cervical tenderness to palpation. Patient does have tenderness to palpation over the right trapezius and cervical paraspinal muscles into the posterior scalp.  Cardiovascular: Normal rate and regular rhythm.  Exam reveals no gallop and no friction rub.   No murmur heard. Pulmonary/Chest: Effort normal and  breath sounds normal. No respiratory distress. She has no wheezes. She has no rales. She exhibits no tenderness.  Abdominal: Soft. Bowel sounds are normal. She exhibits no distension and no mass. There is no tenderness. There is no rebound and no guarding.  Musculoskeletal: Normal range of motion. She exhibits no edema or tenderness.  No midline thoracic or lumbar tenderness. Distal pulses are equal and intact in all extremities.  Lymphadenopathy:    She has no cervical adenopathy.  Neurological: She is alert and oriented to person, place, and time.  Patient is alert and oriented x3 with clear, goal oriented speech. Patient has 5/5 motor in all extremities. Sensation is intact to light touch. Bilateral finger-to-nose is normal with no signs of dysmetria. Patient has a normal gait and walks without assistance.  Skin: Skin is warm and dry. No rash noted. No erythema.  Psychiatric: She has a normal mood and affect. Her behavior is normal.  Nursing note and vitals reviewed.   ED Course  Procedures (including critical care time) Labs Review Labs Reviewed - No data to display  Imaging Review No results found. I have personally reviewed and evaluated these images and lab results as part of my medical decision-making.   EKG Interpretation None      MDM   Final diagnoses:  Cervical strain, initial encounter  Right maxillary sinusitis    Patient is very well-appearing. She has normal vital signs. She has a normal neurologic exam. His symptomatology of muscle skeletal origin to the chest pain as well as possible right maxillary sinus infection. Low suspicion for subarachnoid hemorrhage/meningitis. We'll treat symptomatically and have advised follow-up with a primary physician. She's been given return precautions and is voiced understanding.    Loren Racer, MD 03/14/16 [phone]

## 2016-03-29 ENCOUNTER — Ambulatory Visit: Payer: Self-pay | Admitting: Family Medicine

## 2016-04-11 NOTE — Progress Notes (Signed)
Chief Complaint  Patient presents with  . Dysuria    Pt also c/o still feeling urge to urinate just after urination.  . Abdominal Pain    suprapubic    HPI: Megan Rubio 20 y.o.   Patient Megan Rubio  comes in today for SDA for  new problem evaluation.  see above  Onset  Due for .  Depo ... One new partner no vaginal dc has 1 week for urgency stinging     On dep and has spot period no fever sig abd pain  Never had uti  ROS: See pertinent positives and negatives per HPI. Had some constipation diarrhea  Earlier this week  No fever abd pain  Past Medical History  Diagnosis Date  . Proteinuria 2001    by DS. f/u 24 hr urine WNL    Family History  Problem Relation Age of Onset  . Hypertension Brother     Social History   Social History  . Marital Status: Single    Spouse Name: N/A  . Number of Children: N/A  . Years of Education: N/A   Social History Main Topics  . Smoking status: Current Every Day Smoker -- 0.25 packs/day    Types: Cigars, Cigarettes  . Smokeless tobacco: Never Used  . Alcohol Use: 0.0 oz/week    0 Standard drinks or equivalent per week     Comment: occasional-1x every couple months  . Drug Use: No  . Sexual Activity: Not Asked   Other Topics Concern  . None   Social History Narrative   Parents are divorced   [employer]   Average grades    Father in [employer]   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   Living with brother his girlfriend and 2 young children works weekends                   Outpatient Prescriptions Prior to Visit  Medication Sig Dispense Refill  . acetaminophen (TYLENOL) 500 MG tablet Take 1,000 mg by mouth every 6 (six) hours as needed for moderate pain or headache.    . ibuprofen (ADVIL,MOTRIN) 600 MG tablet Take 1 tablet (600 mg total) by mouth every 6 (six) hours as needed. 30 tablet 0  . loratadine (CLARITIN) 10 MG tablet Take 1 tablet (10 mg total) by mouth daily. 30 tablet 0  . medroxyPROGESTERone  (DEPO-PROVERA) 150 MG/ML injection Inject 150 mg into the muscle every 3 (three) months.    . methocarbamol (ROBAXIN) 500 MG tablet Take 1 tablet (500 mg total) by mouth every 8 (eight) hours as needed for muscle spasms. 20 tablet 0  . mometasone (NASONEX) 50 MCG/ACT nasal spray Place 2 sprays into the nose daily. 17 g 12  . oxymetazoline (AFRIN NASAL SPRAY) 0.05 % nasal spray Place 1 spray into both nostrils 2 (two) times daily. 30 mL 0   No facility-administered medications prior to visit.     EXAM:  BP 102/68 mmHg  Pulse 97  Temp(Src) 98.9 F (37.2 C) (Oral)  Ht  (1.727 m)  Wt 142 lb (64.411 kg)  BMI 21.60 kg/m2  SpO2 100%  LMP 01/26/2016 (Exact Date)  Body mass index is 21.6 kg/(m^2).  GENERAL: vitals reviewed and listed above, alert, oriented, appears well hydrated and in no acute distress HEENT: atraumatic, conjunctiva  clear, no obvious abnormalities on inspection of external nose and ears OP : no lesion edema or exudate  NECK: no obvious masses on inspection palpation  Abdomen:  Sof,t normal bowel sounds without hepatosplenomegaly, no guarding rebound or masses no CVA tendernessMS: moves all extremities without noticeable focal  abnormality PSYCH: pleasant and cooperative, no obvious depression or anxiety  ASSESSMENT AND PLAN:  Discussed the following assessment and plan:  Dysuria - prob uti based on hx and lab u cx screen gc chl  - Plan: Culture, Urine, GC/Chlamydia Amp Probe, Urine, POCT urinalysis dipstick, CANCELED: POC Urinalysis - Glucose/Protein  Encounter for surveillance of injectable contraceptive - Plan: medroxyPROGESTERone (DEPO-PROVERA) injection 150 mg  Screening for STD (sexually transmitted disease) - counseled condom use sti prevention  -Patient advised to return or notify health care team  if symptoms worsen ,persist or new concerns arise.  Patient Instructions  Probably a uti bladder infection   Will let you know when culture results back    Sometimes have to switch antibiotic meds.   Fu if  persistent or progressive also  Urinary Tract Infection Urinary tract infections (UTIs) can develop anywhere along your urinary tract. Your urinary tract is your body's drainage system for removing wastes and extra water. Your urinary tract includes two kidneys, two ureters, a bladder, and a urethra. Your kidneys are a pair of bean-shaped organs. Each kidney is about the size of your fist. They are located below your ribs, one on each side of your spine. CAUSES Infections are caused by microbes, which are microscopic organisms, including fungi, viruses, and bacteria. These organisms are so small that they can only be seen through a microscope. Bacteria are the microbes that most commonly cause UTIs. SYMPTOMS  Symptoms of UTIs may vary by age and gender of the patient and by the location of the infection. Symptoms in young women typically include a frequent and intense urge to urinate and a painful, burning feeling in the bladder or urethra during urination. Older women and men are more likely to be tired, shaky, and weak and have muscle aches and abdominal pain. A fever may mean the infection is in your kidneys. Other symptoms of a kidney infection include pain in your back or sides below the ribs, nausea, and vomiting. DIAGNOSIS To diagnose a UTI, your caregiver will ask you about your symptoms. Your caregiver will also ask you to provide a urine sample. The urine sample will be tested for bacteria and white blood cells. White blood cells are made by your body to help fight infection. TREATMENT  Typically, UTIs can be treated with medication. Because most UTIs are caused by a bacterial infection, they usually can be treated with the use of antibiotics. The choice of antibiotic and length of treatment depend on your symptoms and the type of bacteria causing your infection. HOME CARE INSTRUCTIONS  If you were prescribed antibiotics, take them  exactly as your caregiver instructs you. Finish the medication even if you feel better after you have only taken some of the medication.  Drink enough water and fluids to keep your urine clear or pale yellow.  Avoid caffeine, tea, and carbonated beverages. They tend to irritate your bladder.  Empty your bladder often. Avoid holding urine for long periods of time.  Empty your bladder before and after sexual intercourse.  After a bowel movement, women should cleanse from front to back. Use each tissue only once. SEEK MEDICAL CARE IF:   You have back pain.  You develop a fever.  Your symptoms do not begin to resolve within 3 days. SEEK IMMEDIATE MEDICAL CARE IF:  You have severe back pain or lower abdominal pain.  You develop chills.  You have nausea or vomiting.  You have continued burning or discomfort with urination. MAKE SURE YOU:   Understand these instructions.  Will watch your condition.  Will get help right away if you are not doing well or get worse.   This information is not intended to replace advice given to you by your health care provider. Make sure you discuss any questions you have with your health care provider.   Document Released: 08/22/2005 Document Revised: 08/03/2015 Document Reviewed: 12/21/2011 Elsevier Interactive Patient Education 2016 [employer]       Towamensing TrailsWanda K. Maryfer Tauzin M.D.

## 2016-04-12 ENCOUNTER — Ambulatory Visit (INDEPENDENT_AMBULATORY_CARE_PROVIDER_SITE_OTHER): Payer: BLUE CROSS/BLUE SHIELD | Admitting: Internal Medicine

## 2016-04-12 ENCOUNTER — Encounter: Payer: Self-pay | Admitting: Internal Medicine

## 2016-04-12 ENCOUNTER — Ambulatory Visit: Payer: BLUE CROSS/BLUE SHIELD | Admitting: Family Medicine

## 2016-04-12 ENCOUNTER — Other Ambulatory Visit: Payer: Self-pay | Admitting: Internal Medicine

## 2016-04-12 VITALS — BP 102/68 | HR 97 | Temp 98.9°F | Ht 68.0 in | Wt 142.0 lb

## 2016-04-12 DIAGNOSIS — Z113 Encounter for screening for infections with a predominantly sexual mode of transmission: Secondary | ICD-10-CM | POA: Diagnosis not present

## 2016-04-12 DIAGNOSIS — Z3042 Encounter for surveillance of injectable contraceptive: Secondary | ICD-10-CM | POA: Diagnosis not present

## 2016-04-12 DIAGNOSIS — R300 Dysuria: Secondary | ICD-10-CM | POA: Diagnosis not present

## 2016-04-12 LAB — POCT URINALYSIS DIPSTICK
BILIRUBIN UA: NEGATIVE
GLUCOSE UA: NEGATIVE
Ketones, UA: NEGATIVE
NITRITE UA: NEGATIVE
PH UA: 7
Protein, UA: 1
RBC UA: 3
Spec Grav, UA: 1.02
Urobilinogen, UA: NEGATIVE

## 2016-04-12 MED ORDER — MEDROXYPROGESTERONE ACETATE 150 MG/ML IM SUSP
150.0000 mg | Freq: Once | INTRAMUSCULAR | Status: AC
  Administered 2016-04-12: 150 mg via INTRAMUSCULAR

## 2016-04-12 MED ORDER — NITROFURANTOIN MONOHYD MACRO 100 MG PO CAPS: 100.0000 mg | ORAL_CAPSULE | Freq: Two times a day (BID) | ORAL | Status: DC

## 2016-04-12 NOTE — Patient Instructions (Signed)
Probably a uti bladder infection   Will let you know when culture results back  Sometimes have to switch antibiotic meds.   Fu if  persistent or progressive also  Urinary Tract Infection Urinary tract infections (UTIs) can develop anywhere along your urinary tract. Your urinary tract is your body's drainage system for removing wastes and extra water. Your urinary tract includes two kidneys, two ureters, a bladder, and a urethra. Your kidneys are a pair of bean-shaped organs. Each kidney is about the size of your fist. They are located below your ribs, one on each side of your spine. CAUSES Infections are caused by microbes, which are microscopic organisms, including fungi, viruses, and bacteria. These organisms are so small that they can only be seen through a microscope. Bacteria are the microbes that most commonly cause UTIs. SYMPTOMS  Symptoms of UTIs may vary by age and gender of the patient and by the location of the infection. Symptoms in young women typically include a frequent and intense urge to urinate and a painful, burning feeling in the bladder or urethra during urination. Older women and men are more likely to be tired, shaky, and weak and have muscle aches and abdominal pain. A fever may mean the infection is in your kidneys. Other symptoms of a kidney infection include pain in your back or sides below the ribs, nausea, and vomiting. DIAGNOSIS To diagnose a UTI, your caregiver will ask you about your symptoms. Your caregiver will also ask you to provide a urine sample. The urine sample will be tested for bacteria and white blood cells. White blood cells are made by your body to help fight infection. TREATMENT  Typically, UTIs can be treated with medication. Because most UTIs are caused by a bacterial infection, they usually can be treated with the use of antibiotics. The choice of antibiotic and length of treatment depend on your symptoms and the type of bacteria causing your  infection. HOME CARE INSTRUCTIONS  If you were prescribed antibiotics, take them exactly as your caregiver instructs you. Finish the medication even if you feel better after you have only taken some of the medication.  Drink enough water and fluids to keep your urine clear or pale yellow.  Avoid caffeine, tea, and carbonated beverages. They tend to irritate your bladder.  Empty your bladder often. Avoid holding urine for long periods of time.  Empty your bladder before and after sexual intercourse.  After a bowel movement, women should cleanse from front to back. Use each tissue only once. SEEK MEDICAL CARE IF:   You have back pain.  You develop a fever.  Your symptoms do not begin to resolve within 3 days. SEEK IMMEDIATE MEDICAL CARE IF:   You have severe back pain or lower abdominal pain.  You develop chills.  You have nausea or vomiting.  You have continued burning or discomfort with urination. MAKE SURE YOU:   Understand these instructions.  Will watch your condition.  Will get help right away if you are not doing well or get worse.   This information is not intended to replace advice given to you by your health care provider. Make sure you discuss any questions you have with your health care provider.   Document Released: 08/22/2005 Document Revised: 08/03/2015 Document Reviewed: 12/21/2011 Elsevier Interactive Patient Education [employer]2016 Elsevier Inc.

## 2016-04-13 ENCOUNTER — Other Ambulatory Visit: Payer: Self-pay

## 2016-04-13 LAB — GC/CHLAMYDIA PROBE AMP
CT PROBE, AMP APTIMA: DETECTED — AB
GC Probe RNA: NOT DETECTED

## 2016-04-13 MED ORDER — AZITHROMYCIN 250 MG PO TABS: ORAL_TABLET | ORAL | Status: DC

## 2016-04-15 LAB — URINE CULTURE

## 2016-05-16 ENCOUNTER — Other Ambulatory Visit: Payer: Self-pay | Admitting: Family Medicine

## 2016-05-16 DIAGNOSIS — Z0184 Encounter for antibody response examination: Secondary | ICD-10-CM

## 2016-05-21 ENCOUNTER — Other Ambulatory Visit (INDEPENDENT_AMBULATORY_CARE_PROVIDER_SITE_OTHER): Payer: BLUE CROSS/BLUE SHIELD

## 2016-05-21 DIAGNOSIS — Z0184 Encounter for antibody response examination: Secondary | ICD-10-CM

## 2016-05-22 LAB — RPR

## 2016-05-22 LAB — HIV ANTIBODY (ROUTINE TESTING W REFLEX): HIV 1&2 Ab, 4th Generation: NONREACTIVE

## 2016-07-13 ENCOUNTER — Ambulatory Visit: Payer: BLUE CROSS/BLUE SHIELD | Admitting: Family Medicine

## 2016-10-08 ENCOUNTER — Ambulatory Visit (INDEPENDENT_AMBULATORY_CARE_PROVIDER_SITE_OTHER): Payer: BLUE CROSS/BLUE SHIELD | Admitting: Internal Medicine

## 2016-10-08 ENCOUNTER — Encounter: Payer: Self-pay | Admitting: Internal Medicine

## 2016-10-08 VITALS — BP 120/74 | HR 83 | Temp 98.2°F | Wt 148.0 lb

## 2016-10-08 DIAGNOSIS — B9789 Other viral agents as the cause of diseases classified elsewhere: Secondary | ICD-10-CM | POA: Diagnosis not present

## 2016-10-08 DIAGNOSIS — J069 Acute upper respiratory infection, unspecified: Secondary | ICD-10-CM

## 2016-10-08 DIAGNOSIS — R0789 Other chest pain: Secondary | ICD-10-CM

## 2016-10-08 DIAGNOSIS — R0600 Dyspnea, unspecified: Secondary | ICD-10-CM

## 2016-10-08 NOTE — Progress Notes (Signed)
Pre visit review using our clinic review tool, if applicable. No additional management support is needed unless otherwise documented below in the visit note.  Chief Complaint  Patient presents with  . Shortness of Breath    X2 wks.  . Cough  . Nasal Congestion  . Headache  . Ear Pain    HPI: Megan Rubio 20 y.o. sda  appt Onset of a respiratory illness for about 2 weeks. Started with a sore throat and then coughing and congestion. However last night yesterday she began to have what she calls Left chest cramp and tight     No hx of same    Coughing up thick yellow .    Green at beginning.   No hemoptysis but feels short of breath but no specific wheezing. Works at [employer]. No fever chills   Tobacco   Smoke 2 per day .  No hx of asthma.  Hx of  ocass sob.  Heart skipping   Fast.  Last period   About 3 months  Last depo may denies as a in pregnancy. ROS: See pertinent positives and negatives per HPI. On no estrogen no hx dvt   Past Medical History:  Diagnosis Date  . Proteinuria 2001   by DS. f/u 24 hr urine WNL    Family History  Problem Relation Age of Onset  . Hypertension Brother     Social History   Social History  . Marital status: Single    Spouse name: N/A  . Number of children: N/A  . Years of education: N/A   Social History Main Topics  . Smoking status: Current Every Day Smoker    Packs/day: 0.25    Types: Cigars, Cigarettes  . Smokeless tobacco: Never Used  . Alcohol use 0.0 oz/week     Comment: occasional-1x every couple months  . Drug use: No  . Sexual activity: Not Asked   Other Topics Concern  . 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     Neg tad.   Living with brother his girlfriend and 2 young children works weekends                   Outpatient Medications Prior to Visit  Medication Sig Dispense Refill  . acetaminophen  (TYLENOL) 500 MG tablet Take 1,000 mg by mouth every 6 (six) hours as needed for moderate pain or headache.    . loratadine (CLARITIN) 10 MG tablet Take 1 tablet (10 mg total) by mouth daily. 30 tablet 0  . mometasone (NASONEX) 50 MCG/ACT nasal spray Place 2 sprays into the nose daily. 17 g 12  . ibuprofen (ADVIL,MOTRIN) 600 MG tablet Take 1 tablet (600 mg total) by mouth every 6 (six) hours as needed. 30 tablet 0  . azithromycin (ZITHROMAX) 250 MG tablet Take 2 tablets by mouth once. 2 tablet 0  . medroxyPROGESTERone (DEPO-PROVERA) 150 MG/ML injection Inject 150 mg into the muscle every 3 (three) months.    . methocarbamol (ROBAXIN) 500 MG tablet Take 1 tablet (500 mg total) by mouth every 8 (eight) hours as needed for muscle spasms. 20 tablet 0  . nitrofurantoin, macrocrystal-monohydrate, (MACROBID) 100 MG capsule Take 1 capsule (100 mg total) by mouth 2 (two) times daily. 10 capsule 0  . oxymetazoline (AFRIN NASAL SPRAY) 0.05 % nasal spray Place  1 spray into both nostrils 2 (two) times daily. 30 mL 0   No facility-administered medications prior to visit.      EXAM:  BP 120/74 (BP Location: Right Arm, Patient Position: Sitting, Cuff Size: Normal)   Pulse 83   Temp 98.2 F (36.8 C) (Oral)   Wt 148 lb (67.1 kg)   SpO2 98%   BMI 22.50 kg/m   Body mass index is 22.5 kg/m. WDWN in NAD  quiet respirations; mildly congested  . Non toxic . HEENT: Normocephalic ;atraumatic , Eyes;  PERRL, EOMs  Full, lids and conjunctiva clear,,Ears: no deformities, canals nl, TM landmarks normal, Nose: no deformity or discharge but congested;face minimally tender Mouth : OP clear without lesion or edema . Neck: Supple without adenopathy or masses or bruits Chest:  Clear to A&P without wheezes rales or rhonchi CV:  S1-S2 no gallops or murmurs peripheral perfusion is normal no edema  Abdomen:  Sof,t normal bowel sounds without hepatosplenomegaly, no guarding rebound or masses no CVA tenderness Skin :nl  perfusion and no acute rashes  Abdomen soft without organomegaly   ASSESSMENT AND PLAN:  Discussed the following assessment and plan:  Intermittent left-sided chest pain - Plan: DG Chest 2 View  Viral upper respiratory tract infection with cough - Plan: DG Chest 2 View  Dyspnea, unspecified type - Plan: DG Chest 2 View Uncertain cause but this sounds like a viral respiratory infection but with her new onset left-sided pain which could be from coughing or chest wall would get chest x-ray to rule out occult pneumonia etc. She is not smoking very much but discussed with her to stop this could be aggravating her respiratory status. I don't hear acute wheezing and her pulse ox is good. However if continued without improvement over the next few days despite a negative x-ray consider antibiotic treatment sign up for my chart send a message.  -Patient advised to return or notify health care team  if symptoms worsen ,persist or new concerns arise.  Patient Instructions  Exam is reassuring and probably viral respiratory infection that will resolve on its own but  If x ray shows  poneumonia or  getting worse in the next day or 2  We will add antibiotic for  Possible bacterial infection .  Please stoptobacco it is bad for your health and lungs     [name]. Luka Reisch M.D.

## 2016-10-08 NOTE — Patient Instructions (Signed)
Exam is reassuring and probably viral respiratory infection that will resolve on its own but  If x ray shows  poneumonia or  getting worse in the next day or 2  We will add antibiotic for  Possible bacterial infection .  Please stoptobacco it is bad for your health and lungs

## 2016-10-09 ENCOUNTER — Emergency Department (HOSPITAL_COMMUNITY): Payer: BLUE CROSS/BLUE SHIELD

## 2016-10-09 ENCOUNTER — Encounter (HOSPITAL_COMMUNITY): Payer: Self-pay | Admitting: Emergency Medicine

## 2016-10-09 ENCOUNTER — Emergency Department (HOSPITAL_COMMUNITY)
Admission: EM | Admit: 2016-10-09 | Discharge: 2016-10-09 | Disposition: A | Discharge status: Home / Self Care | Payer: BLUE CROSS/BLUE SHIELD | Attending: Emergency Medicine | Admitting: Emergency Medicine

## 2016-10-09 DIAGNOSIS — R079 Chest pain, unspecified: Secondary | ICD-10-CM | POA: Insufficient documentation

## 2016-10-09 DIAGNOSIS — F17210 Nicotine dependence, cigarettes, uncomplicated: Secondary | ICD-10-CM | POA: Diagnosis not present

## 2016-10-09 DIAGNOSIS — J069 Acute upper respiratory infection, unspecified: Secondary | ICD-10-CM | POA: Diagnosis not present

## 2016-10-09 LAB — POC URINE PREG, ED: Preg Test, Ur: NEGATIVE

## 2016-10-09 MED ORDER — CETIRIZINE HCL 10 MG PO TABS: 10.0000 mg | ORAL_TABLET | Freq: Every day | ORAL | 1 refills | Status: DC

## 2016-10-09 MED ORDER — NAPROXEN 250 MG PO TABS: 250.0000 mg | ORAL_TABLET | Freq: Two times a day (BID) | ORAL | 0 refills | Status: DC

## 2016-10-09 MED ORDER — BENZONATATE 100 MG PO CAPS: 100.0000 mg | ORAL_CAPSULE | Freq: Three times a day (TID) | ORAL | 0 refills | Status: DC

## 2016-10-09 MED ORDER — FLUTICASONE PROPIONATE 50 MCG/ACT NA SUSP: 2.0000 | Freq: Every day | NASAL | 0 refills | Status: DC

## 2016-10-09 NOTE — ED Triage Notes (Signed)
Pt. Came in with complaint of chest pain which started 2 days ago. Pt. Woke up today with cramping like chest pain at 8/10. Pt. Seen by her primary MD Monday and was told to have Chest Xray to check for pneumonia. Pt. Did not go for Chest Xray. Denied of SOB.

## 2016-10-09 NOTE — ED Provider Notes (Signed)
[facility]-EMERGENCY DEPT Provider Note   CSN: [id number] Arrival date & time: 10/09/16  0454     History   Chief Complaint Chief Complaint  Patient presents with  . Chest Pain    HPI CRISTAN HOUT is a 20 y.o. female.  LASHONTA PILLING is a 20 y.o. Female who presents to the ED complaining of cough, chest pain, nasal congestion, and post nasal drip for the past 2 weeks. She reports having some pain in her left upper chest starting two days ago. She was seen by her pcp and was given an order for a chest x-ray but she was unable to go do this. She reports a productive cough with spitting up mucus. No blood tinged sputum. She reports the pain in her chest is worse with movement of her arm or with palpation. She reports feeling short of breath through her nose. No wheezing. She has taken nothing for treatment of her symptoms. She denies fevers, hemoptysis, abdominal pain, nausea, vomiting, lightheadedness, syncope, leg pain, leg swelling, endogenous estrogen use, leg pain, leg swelling, or rashes.    The history is provided by the patient. No language interpreter was used.  Chest Pain   Pertinent negatives include no abdominal pain, no back pain, no cough, no fever, no headaches, no nausea, no palpitations and no vomiting.    Past Medical History:  Diagnosis Date  . Proteinuria 2001   by DS. f/u 24 hr urine WNL    Patient Active Problem List   Diagnosis Date Noted  . Encounter for initial prescription of injectable contraceptive 02/23/2014  . Irregular menstrual cycle 02/23/2014  . Screening for STD (sexually transmitted disease) 02/23/2014  . Pharyngitis, acute 10/17/2012  . Ganglion cyst 03/23/2011  . HEADACHE 10/27/2010    History reviewed. No pertinent surgical history.  OB History    No data available       Home Medications    Prior to Admission medications   Medication Sig Start Date End Date Taking? Authorizing Provider  acetaminophen (TYLENOL) 500 MG tablet  Take 1,000 mg by mouth every 6 (six) hours as needed for moderate pain or headache.   Yes Historical Provider, MD  benzonatate (TESSALON) 100 MG capsule Take 1 capsule (100 mg total) by mouth every 8 (eight) hours. 10/09/16   Everlene Farrier, PA-C  cetirizine (ZYRTEC ALLERGY) 10 MG tablet Take 1 tablet (10 mg total) by mouth daily. 10/09/16   Everlene Farrier, PA-C  fluticasone (FLONASE) 50 MCG/ACT nasal spray Place 2 sprays into both nostrils daily. 10/09/16   Everlene Farrier, PA-C  naproxen (NAPROSYN) 250 MG tablet Take 1 tablet (250 mg total) by mouth 2 (two) times daily with a meal. 10/09/16   Everlene Farrier, PA-C    Family History Family History  Problem Relation Age of Onset  . Hypertension Brother     Social History Social History  Substance Use Topics  . Smoking status: Current Every Day Smoker    Packs/day: 0.25    Types: Cigars, Cigarettes  . Smokeless tobacco: Never Used  . Alcohol use 0.0 oz/week     Comment: occasional-1x every couple months     Allergies   Sulfa antibiotics   Review of Systems Review of Systems  Constitutional: Negative for chills and fever.  HENT: Positive for congestion, postnasal drip, rhinorrhea and sneezing. Negative for ear pain, sore throat and trouble swallowing.   Eyes: Negative for visual disturbance.  Respiratory: Negative for cough and wheezing.   Cardiovascular: Positive for chest pain.  Negative for palpitations.  Gastrointestinal: Negative for abdominal pain, diarrhea, nausea and vomiting.  Genitourinary: Negative for dysuria, frequency and urgency.  Musculoskeletal: Negative for back pain and neck pain.  Skin: Negative for rash.  Neurological: Negative for syncope, light-headedness and headaches.     Physical Exam Updated Vital Signs BP 104/71   Pulse 66   Temp 98.7 F (37.1 C) (Oral)   Resp 19   SpO2 99%   Physical Exam  Constitutional: She is oriented to person, place, and time. She appears well-developed and  well-nourished. No distress.  Nontoxic-appearing.  HENT:  Head: Normocephalic and atraumatic.  Right Ear: External ear normal.  Left Ear: External ear normal.  Mouth/Throat: Oropharynx is clear and moist.  Mild middle ear effusion noted bilaterally. No TM erythema or loss of landmarks. Boggy nasal turbinates bilaterally. Rhinorrhea present. Throat is clear.  Eyes: Conjunctivae are normal. Pupils are equal, round, and reactive to light. Right eye exhibits no discharge. Left eye exhibits no discharge.  Neck: Neck supple. No JVD present. No tracheal deviation present.  Cardiovascular: Normal rate, regular rhythm, normal heart sounds and intact distal pulses.  Exam reveals no gallop and no friction rub.   No murmur heard. Bilateral radial and posterior tibialis pulses are intact.  Pulmonary/Chest: Effort normal and breath sounds normal. No stridor. No respiratory distress. She has no wheezes. She has no rales. She exhibits tenderness.  Lungs are clear to auscultation bilaterally. No increased work of breathing. Oxygen saturation is 100% on room air. No rales or rhonchi. Patient has some left upper chest wall tenderness to palpation which reproduces her chest pain.  Abdominal: Soft. There is no tenderness.  Musculoskeletal: She exhibits no edema or tenderness.  No lower extremity edema or tenderness.  Lymphadenopathy:    She has no cervical adenopathy.  Neurological: She is alert and oriented to person, place, and time. Coordination normal.  Skin: Skin is warm and dry. Capillary refill takes less than 2 seconds. No rash noted. She is not diaphoretic. No erythema. No pallor.  Psychiatric: She has a normal mood and affect. Her behavior is normal.  Nursing note and vitals reviewed.    ED Treatments / Results  Labs (all labs ordered are listed, but only abnormal results are displayed) Labs Reviewed  POC URINE PREG, ED    EKG  EKG Interpretation  Date/Time:  Tuesday October 09 2016  05:03:07 EST Ventricular Rate:  80 PR Interval:    QRS Duration: 86 QT Interval:  378 QTC Calculation: 436 R Axis:   60 Text Interpretation:  Sinus rhythm Confirmed by [facility]-RASCH  MD, APRIL ([id number]) on 10/09/2016 5:28:33 AM       Radiology Dg Chest 2 View  Result Date: 10/09/2016 CLINICAL DATA:  Initial evaluation for acute shortness of breath. EXAM: CHEST  2 VIEW COMPARISON:  Prior radiograph from 10/31/2014. FINDINGS: The cardiac and mediastinal silhouettes are stable in size and contour, and remain within normal limits. The lungs are normally inflated. No airspace consolidation, pleural effusion, or pulmonary edema is identified. There is no pneumothorax. No acute osseous abnormality identified. IMPRESSION: No active cardiopulmonary disease. Electronically Signed   By: Rise MuBenjamin  McClintock M.D.   On: 10/09/2016 05:49    Procedures Procedures (including critical care time)  Medications Ordered in ED Medications - No data to display   Initial Impression / Assessment and Plan / ED Course  I have reviewed the triage vital signs and the nursing notes.  Pertinent labs & imaging results that were available  during my care of the patient were reviewed by me and considered in my medical decision making (see chart for details).  Clinical Course    This is a 20 y.o. Female who presents to the ED complaining of cough, chest pain, nasal congestion, and post nasal drip for the past 2 weeks. She reports having some pain in her left upper chest starting two days ago. She was seen by her pcp and was given an order for a chest x-ray but she was unable to go do this. She reports a productive cough with spitting up mucus. No blood tinged sputum. She reports the pain in her chest is worse with movement of her arm or with palpation. She reports feeling short of breath through her nose. No wheezing.  On exam the patient is afebrile nontoxic-appearing. She is resting comfortably in the room. Lungs are  clear to auscultation bilaterally. She is some left upper chest wall tenderness to palpation which reproduces her chest pain. EKG shows normal sinus rhythm. Chest x-ray is clear. She is PERC negative. Low suspicion for ACS or PE. Patient with URI with cough. Will start on Zyrtec, Flonase, naproxen and Tessalon Perles. I encouraged close follow-up by her primary care doctor. I discussed return precautions. I advised the patient to follow-up with their primary care provider this week. I advised the patient to return to the emergency department with new or worsening symptoms or new concerns. The patient verbalized understanding and agreement with plan.     Final Clinical Impressions(s) / ED Diagnoses   Final diagnoses:  Viral upper respiratory tract infection  Nonspecific chest pain    New Prescriptions New Prescriptions   BENZONATATE (TESSALON) 100 MG CAPSULE    Take 1 capsule (100 mg total) by mouth every 8 (eight) hours.   CETIRIZINE (ZYRTEC ALLERGY) 10 MG TABLET    Take 1 tablet (10 mg total) by mouth daily.   FLUTICASONE (FLONASE) 50 MCG/ACT NASAL SPRAY    Place 2 sprays into both nostrils daily.   NAPROXEN (NAPROSYN) 250 MG TABLET    Take 1 tablet (250 mg total) by mouth 2 (two) times daily with a meal.     Everlene FarrierWilliam Kamaile Zachow, PA-C 10/09/16 0701    Cy BlamerApril Palumbo, MD 10/09/16 [phone]

## 2016-10-09 NOTE — ED Notes (Signed)
MD d/c lab work.

## 2016-10-09 NOTE — ED Notes (Signed)
Bed: WA03 Expected date:  Expected time:  Means of arrival:  Comments: 20 yo F/ Fever chills

## 2017-01-09 IMAGING — CR DG CHEST 2V
2 series · 2 of 2 positions shown · non-contrast
Comparison: Prior radiograph from 10/31/2014.

CLINICAL DATA: Initial evaluation for acute shortness of breath.

EXAM:
CHEST  2 VIEW

[w chest pa]
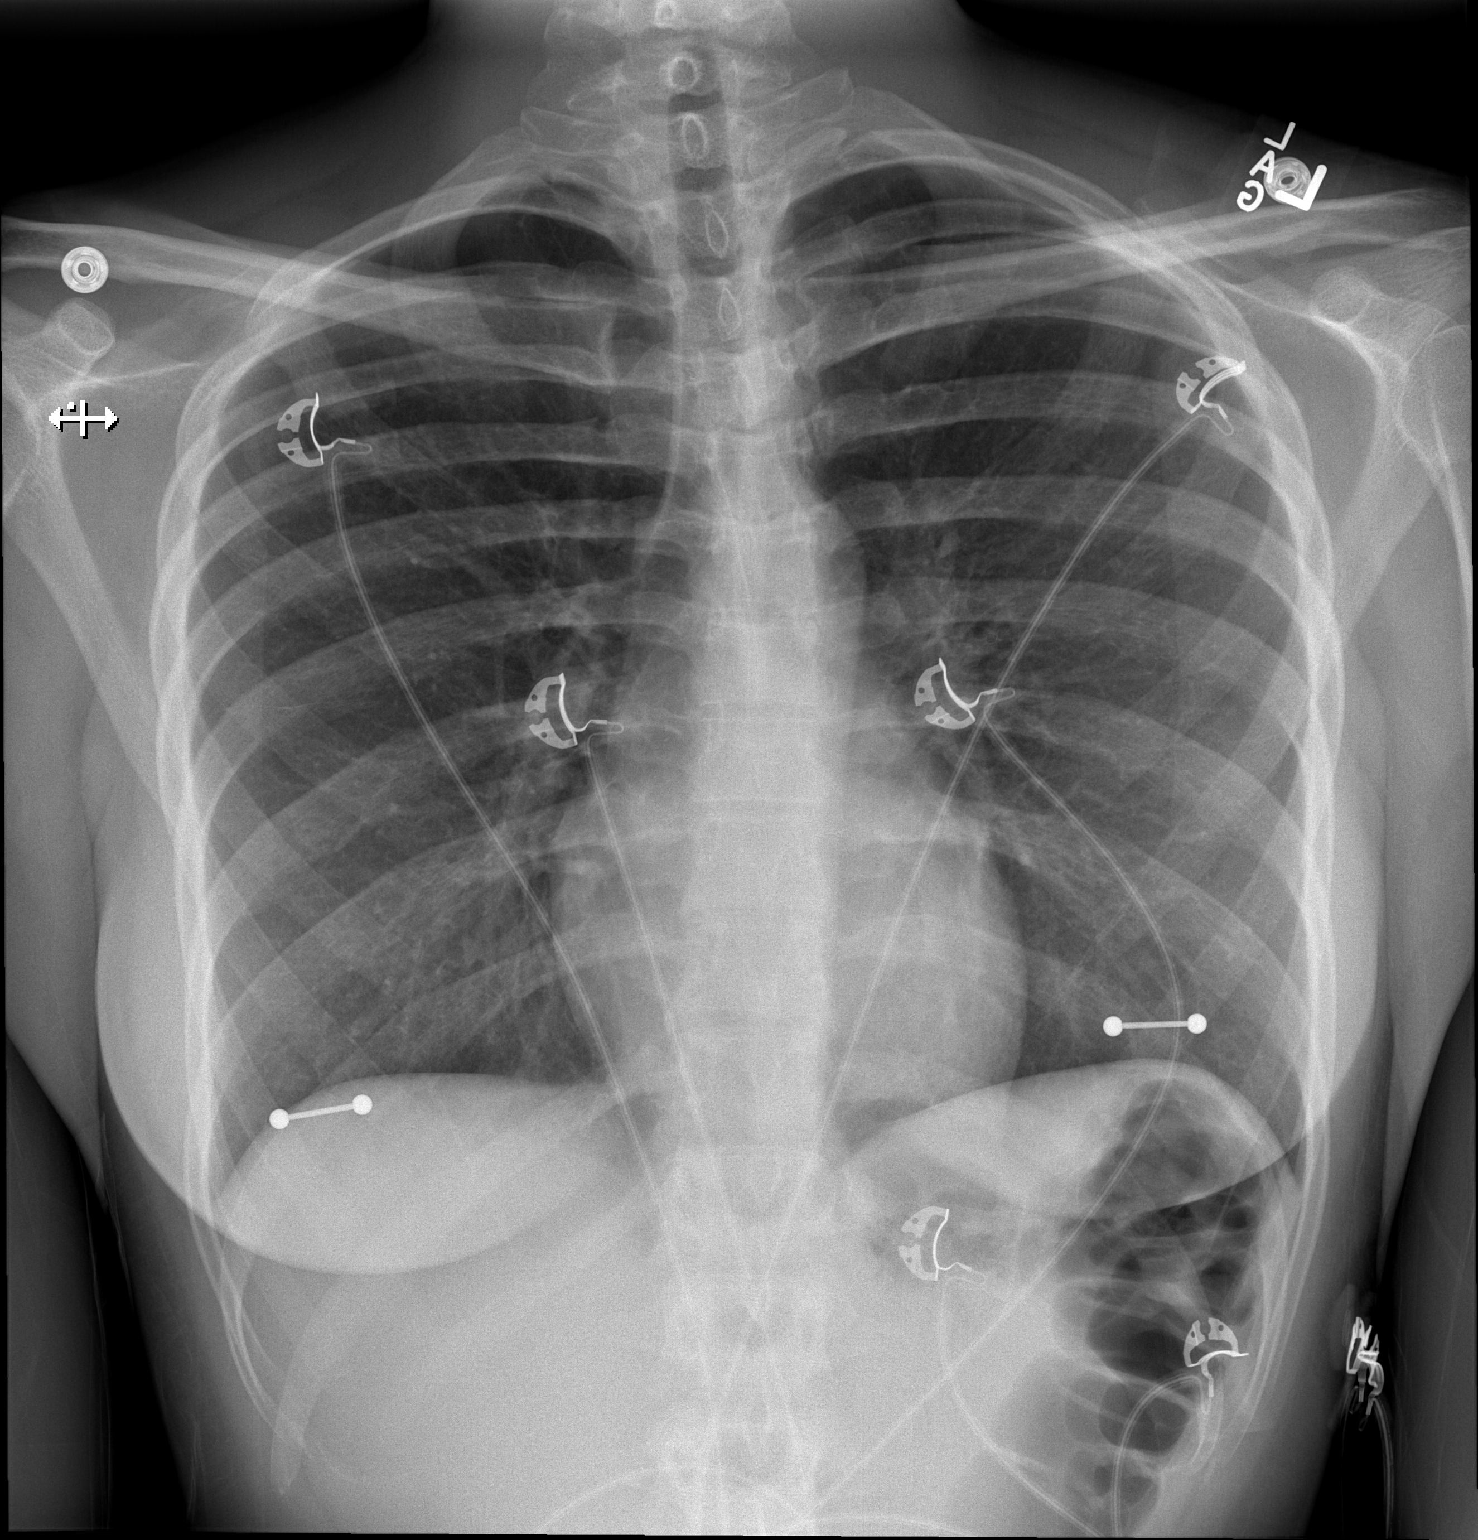

[w chest lat]
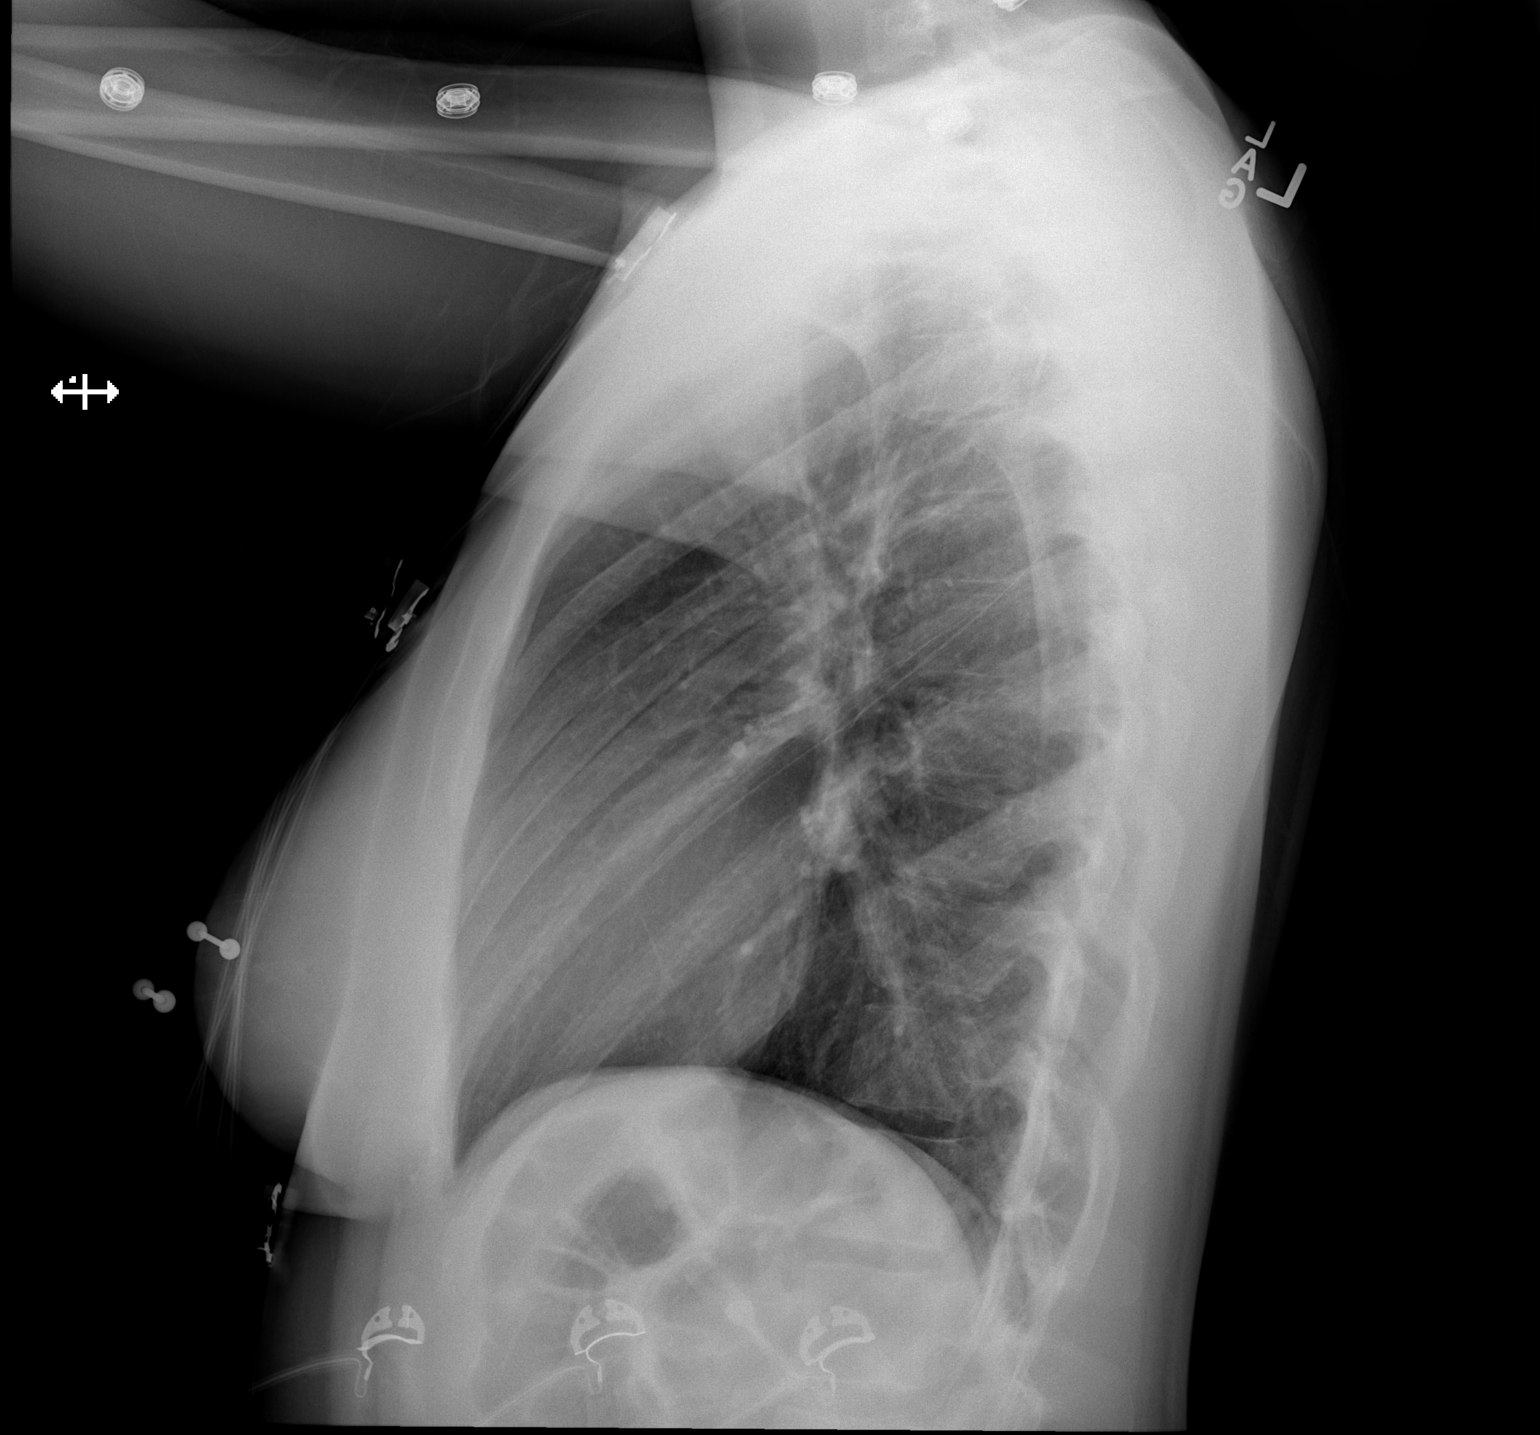

[2 of 2 positions shown; findings below may reference images not displayed]

FINDINGS: The cardiac and mediastinal silhouettes are stable in size and
contour, and remain within normal limits.

The lungs are normally inflated. No airspace consolidation, pleural
effusion, or pulmonary edema is identified. There is no
pneumothorax.

No acute osseous abnormality identified.
IMPRESSION: No active cardiopulmonary disease.

## 2017-01-10 ENCOUNTER — Emergency Department (HOSPITAL_COMMUNITY)
Admission: EM | Admit: 2017-01-10 | Discharge: 2017-01-10 | Disposition: A | Discharge status: Home / Self Care | Payer: BLUE CROSS/BLUE SHIELD | Attending: Emergency Medicine | Admitting: Emergency Medicine

## 2017-01-10 ENCOUNTER — Encounter (HOSPITAL_COMMUNITY): Payer: Self-pay | Admitting: Emergency Medicine

## 2017-01-10 DIAGNOSIS — J111 Influenza due to unidentified influenza virus with other respiratory manifestations: Secondary | ICD-10-CM | POA: Insufficient documentation

## 2017-01-10 DIAGNOSIS — F17210 Nicotine dependence, cigarettes, uncomplicated: Secondary | ICD-10-CM | POA: Insufficient documentation

## 2017-01-10 DIAGNOSIS — R69 Illness, unspecified: Secondary | ICD-10-CM

## 2017-01-10 DIAGNOSIS — Z720 Tobacco use: Secondary | ICD-10-CM

## 2017-01-10 DIAGNOSIS — Z79899 Other long term (current) drug therapy: Secondary | ICD-10-CM | POA: Insufficient documentation

## 2017-01-10 MED ORDER — OSELTAMIVIR PHOSPHATE 75 MG PO CAPS: 75.0000 mg | ORAL_CAPSULE | Freq: Two times a day (BID) | ORAL | 0 refills | Status: DC

## 2017-01-10 MED ORDER — ACETAMINOPHEN 500 MG PO TABS
1000.0000 mg | ORAL_TABLET | Freq: Once | ORAL | Status: AC
  Administered 2017-01-10: 1000 mg via ORAL
  Filled 2017-01-10: qty 2

## 2017-01-10 NOTE — ED Triage Notes (Signed)
Pt c/o fever, sore throat, chills, body aches, headache since yesterday am. Pt has been taking acetaminophen at home for fevers. C/o nausea but denies vomiting/diarrhea.

## 2017-01-10 NOTE — ED Provider Notes (Signed)
[facility]-EMERGENCY DEPT Provider Note   CSN: [id number] Arrival date & time: 01/10/17  1039  By signing my name below, I, Marnette Burgess Long, attest that this documentation has been prepared under the direction and in the presence of [address], [employer]. Electronically Signed: Marnette Burgess Long, Scribe. 01/10/2017. 11:37 AM.    History   Chief Complaint Chief Complaint  Patient presents with  . Flu like Symptoms   The history is provided by the patient and medical records. No language interpreter was used.  Cough  This is a new problem. The current episode started yesterday. The problem occurs constantly. The problem has been gradually worsening. The cough is non-productive. The maximum temperature recorded prior to her arrival was 102 to 102.9 F. The fever has been present for less than 1 day. Associated symptoms include chills, headaches, sore throat and myalgias (generalized). Pertinent negatives include no chest pain, no ear pain, no rhinorrhea, no shortness of breath and no wheezing. Treatments tried: alka seltzer and tylenol. The treatment provided mild relief. She is a smoker.   HPI Comments:  Megan Rubio is a 21 y.o. female who presents to the Emergency Department complaining of persistent, gradually worsening, dry cough onset yesterday morning and associated URI symptoms. She states waking up yesterday morning with the fever (TMax 102.1) and cough and then going to work where they progressively worsened all day. She has additional associated symptoms of chills, generalized body aches, sore throat d/t cough, and HA. 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 She notes no exacerbating factors. She states sick contact with similar symptoms from her nephew. Pt denies rhinorrhea, drooling, trismus, ear pain/drainage, CP, SOB, wheezing, abdominal pain, N/V/D/C, dysuria, hematuria, arthralgias, numbness,  tingling, focal weakness, rashes, or any other associated symptoms at this time. Pt is a current every day smoker.  Past Medical History:  Diagnosis Date  . Proteinuria 2001   by DS. f/u 24 hr urine WNL    Patient Active Problem List   Diagnosis Date Noted  . Encounter for initial prescription of injectable contraceptive 02/23/2014  . Irregular menstrual cycle 02/23/2014  . Screening for STD (sexually transmitted disease) 02/23/2014  . Pharyngitis, acute 10/17/2012  . Ganglion cyst 03/23/2011  . HEADACHE 10/27/2010    History reviewed. No pertinent surgical history.  OB History    No data available       Home Medications    Prior to Admission medications   Medication Sig Start Date End Date Taking? Authorizing Provider  acetaminophen (TYLENOL) 500 MG tablet Take 1,000 mg by mouth every 6 (six) hours as needed for moderate pain or headache.    Historical Provider, MD  benzonatate (TESSALON) 100 MG capsule Take 1 capsule (100 mg total) by mouth every 8 (eight) hours. 10/09/16   Everlene Farrier, PA-C  cetirizine (ZYRTEC ALLERGY) 10 MG tablet Take 1 tablet (10 mg total) by mouth daily. 10/09/16   Everlene Farrier, PA-C  fluticasone (FLONASE) 50 MCG/ACT nasal spray Place 2 sprays into both nostrils daily. 10/09/16   Everlene Farrier, PA-C  naproxen (NAPROSYN) 250 MG tablet Take 1 tablet (250 mg total) by mouth 2 (two) times daily with a meal. 10/09/16   Everlene Farrier, PA-C    Family History Family History  Problem Relation Age of Onset  . Hypertension Brother     Social History Social History  Substance Use Topics  . Smoking status: Current Every  Day Smoker    Packs/day: 0.25    Types: Cigars, Cigarettes  . Smokeless tobacco: Never Used  . Alcohol use 0.0 oz/week     Comment: occasional-1x every couple months     Allergies   Sulfa antibiotics   Review of Systems Review of Systems  Constitutional: Positive for chills and fever.  HENT: Positive for sore throat.  Negative for drooling, ear discharge, ear pain, rhinorrhea and trouble swallowing.   Respiratory: Positive for cough. Negative for shortness of breath and wheezing.   Cardiovascular: Negative for chest pain.  Gastrointestinal: Negative for abdominal pain, constipation, diarrhea, nausea and vomiting.  Genitourinary: Negative for dysuria and hematuria.  Musculoskeletal: Positive for myalgias (generalized). Negative for arthralgias.  Skin: Negative for rash.  Allergic/Immunologic: Negative for immunocompromised state.  Neurological: Positive for headaches. Negative for weakness and numbness.  Psychiatric/Behavioral: Negative for confusion.   10 Systems reviewed and are negative for acute change except as noted in the HPI.   Physical Exam Updated Vital Signs BP 127/77 (BP Location: Right Arm)   Pulse 102   Temp 100.1 F (37.8 C) (Oral)   Resp 16   Ht 5\' 8"  (1.727 m)   Wt 150 lb (68 kg)   LMP 12/27/2016   SpO2 100%   BMI 22.81 kg/m   Physical Exam  Constitutional: She is oriented to person, place, and time. Vital signs are normal. She appears well-developed and well-nourished.  Non-toxic appearance. No distress.  Febrile 100.1, nontoxic, NAD  HENT:  Head: Normocephalic and atraumatic.  Nose: Mucosal edema and rhinorrhea present.  Mouth/Throat: Uvula is midline, oropharynx is clear and moist and mucous membranes are normal. No trismus in the jaw. No uvula swelling. Tonsils are 0 on the right. Tonsils are 0 on the left. No tonsillar exudate.  Nose with mild mucosal edema and rhinorrhea. Oropharynx clear and moist, without uvular swelling or deviation, no trismus or drooling, no tonsillar swelling or erythema, no exudates.  Eyes: Conjunctivae and EOM are normal. Right eye exhibits no discharge. Left eye exhibits no discharge.  Neck: Normal range of motion. Neck supple.  Cardiovascular: Normal rate, regular rhythm, normal heart sounds and intact distal pulses.  Exam reveals no gallop and  no friction rub.   No murmur heard. Pulmonary/Chest: Effort normal and breath sounds normal. No respiratory distress. She has no decreased breath sounds. She has no wheezes. She has no rhonchi. She has no rales.  CTAB in all lung fields, no w/r/r, no hypoxia or increased WOB, speaking in full sentences, SpO2 100% on RA  Abdominal: Soft. Normal appearance and bowel sounds are normal. She exhibits no distension. There is no tenderness. There is no rigidity, no rebound, no guarding, no CVA tenderness, no tenderness at McBurney's point and negative Murphy's sign.  Musculoskeletal: Normal range of motion.  Neurological: She is alert and oriented to person, place, and time. She has normal strength. No sensory deficit.  Skin: Skin is warm, dry and intact. No rash noted.  Psychiatric: She has a normal mood and affect.  Nursing note and vitals reviewed.    ED Treatments / Results  DIAGNOSTIC STUDIES:  Oxygen Saturation is 100% on RA, normal by my interpretation.    COORDINATION OF CARE:  11:33 AM Discussed treatment plan with pt at bedside including Tamiflu, rest, fluid increase, alternating Tylenol and Ibuprofen for fever, and symptom management and pt agreed to plan. Pt also advised to f/u with PCP a week and a half from now.   Labs (all  labs ordered are listed, but only abnormal results are displayed) Labs Reviewed - No data to display  EKG  EKG Interpretation None       Radiology No results found.  Procedures Procedures (including critical care time)  Medications Ordered in ED Medications  acetaminophen (TYLENOL) tablet 1,000 mg (1,000 mg Oral Given 01/10/17 1141)     Initial Impression / Assessment and Plan / ED Course  I have reviewed the triage vital signs and the nursing notes.  Pertinent labs & imaging results that were available during my care of the patient were reviewed by me and considered in my medical decision making (see chart for details).     21 y.o. female  here with flu like symptoms x1 day. Pt has low grade fever, will give tylenol, but with a clear lung exam. Mild rhinorrhea. Likely viral/flu URI. Will rx tamiflu given that she's in the window of potential benefit. Pt is otherwise agreeable to symptomatic treatment with close follow up with PCP as needed but spoke at length about emergent changing or worsening of symptoms that should prompt return to ER. Pt voices understanding and is agreeable to plan. Stable at time of discharge. Smoking cessation advised.  I personally performed the services described in this documentation, which was scribed in my presence. The recorded information has been reviewed and is accurate.   Final Clinical Impressions(s) / ED Diagnoses   Final diagnoses:  Influenza-like illness  Tobacco user    New Prescriptions New Prescriptions   OSELTAMIVIR (TAMIFLU) 75 MG CAPSULE    Take 1 capsule (75 mg total) by mouth every 12 (twelve) hours.     [address] Cherry CourtMercedes Janiylah Hannis, PA-C 01/10/17 1152    Tilden FossaElizabeth Rees, MD 01/11/17 [phone]

## 2017-01-10 NOTE — Discharge Instructions (Signed)
Continue to stay well-hydrated. Gargle warm salt water and spit it out. Use chloraseptic spray as needed for sore throat. Continue to alternate between Tylenol and Ibuprofen for pain or fever. Use Mucinex for cough suppression/expectoration of mucus. Use netipot and flonase to help with nasal congestion. May consider over-the-counter Benadryl or other antihistamine to decrease secretions and for help with your symptoms. Take tamiflu as directed which will help decrease the severity and duration of symptoms if your illness is due to the flu; however, if you don't have the flu and it's just another viral illness, it will get better with time but tamiflu will not have much effect on it. TAMIFLU DOES NOT CURE THE FLU! Take on a full stomach as it can cause some nausea. STOP SMOKING!  Follow up with your primary care doctor in 7-10 days for recheck of ongoing symptoms. Return to emergency department for emergent changing or worsening of symptoms.

## 2020-08-30 ENCOUNTER — Other Ambulatory Visit: Payer: Self-pay

## 2020-08-31 ENCOUNTER — Ambulatory Visit: Payer: BLUE CROSS/BLUE SHIELD | Admitting: Family Medicine

## 2021-07-03 ENCOUNTER — Other Ambulatory Visit: Payer: Self-pay

## 2021-07-03 ENCOUNTER — Ambulatory Visit (INDEPENDENT_AMBULATORY_CARE_PROVIDER_SITE_OTHER): Payer: 59 | Admitting: Family Medicine

## 2021-07-03 ENCOUNTER — Encounter: Payer: Self-pay | Admitting: Family Medicine

## 2021-07-03 VITALS — BP 120/70 | HR 129 | Resp 16 | Ht 68.0 in | Wt 195.0 lb

## 2021-07-03 DIAGNOSIS — R7989 Other specified abnormal findings of blood chemistry: Secondary | ICD-10-CM

## 2021-07-03 DIAGNOSIS — N390 Urinary tract infection, site not specified: Secondary | ICD-10-CM | POA: Diagnosis not present

## 2021-07-03 DIAGNOSIS — R1011 Right upper quadrant pain: Secondary | ICD-10-CM | POA: Diagnosis not present

## 2021-07-03 DIAGNOSIS — R300 Dysuria: Secondary | ICD-10-CM | POA: Diagnosis not present

## 2021-07-03 DIAGNOSIS — D72829 Elevated white blood cell count, unspecified: Secondary | ICD-10-CM

## 2021-07-03 DIAGNOSIS — R000 Tachycardia, unspecified: Secondary | ICD-10-CM

## 2021-07-03 LAB — POCT URINALYSIS DIPSTICK
Bilirubin, UA: NEGATIVE
Blood, UA: POSITIVE
Glucose, UA: NEGATIVE
Ketones, UA: POSITIVE
Nitrite, UA: NEGATIVE
Protein, UA: POSITIVE — AB
Spec Grav, UA: 1.01 (ref 1.010–1.025)
Urobilinogen, UA: 0.2 E.U./dL
pH, UA: 6 (ref 5.0–8.0)

## 2021-07-03 MED ORDER — NITROFURANTOIN MONOHYD MACRO 100 MG PO CAPS: 100.0000 mg | ORAL_CAPSULE | Freq: Two times a day (BID) | ORAL | 0 refills | Status: AC

## 2021-07-03 NOTE — Progress Notes (Addendum)
Chief Complaint  Patient presents with   Urinary Tract Infection   Abdominal Pain    RUQ   HPI:  Megan Rubio is a 25 y.o. female, who is here today with her friend c/o 4 days of urinary symptoms and RUQ pain. Urinary Tract Infection  This is a new problem. The current episode started in the past 7 days. The problem occurs intermittently. The problem has been unchanged. The quality of the pain is described as burning. The pain is mild. There has been no fever. She is Sexually active. There is No history of pyelonephritis. Associated symptoms include flank pain, frequency and urgency. Pertinent negatives include no chills, discharge, hematuria, hesitancy, nausea, possible pregnancy, sweats or vomiting. She has tried nothing for the symptoms. There is no history of catheterization, kidney stones, recurrent UTIs, a single kidney, urinary stasis or a urological procedure.  Minimal suprapubic pressure, intermittent.  RUQ abdominal pain: Started around 4 days ago,intermittent. RUQ present when she gets up in the morning. She has not identified exacerbating factors. It is not related with food intake. No associated symptoms.  Sharp pain, 7-8/10, not radiated. Last bowel movement today. Denies prior Hx. She has taken Advil in the morning when she gets up and pain is well controlled through the day. Decreased appetite. No sick contact or recent travel. LMP 06/14/21. No heavy menses.  Lab Results  Component Value Date   ALT 10 10/27/2010   AST 21 10/27/2010   ALKPHOS 116 10/27/2010   BILITOT 1.1 10/27/2010   Noted sinus tachycardia. Denies severe/frequent headache, chest pain, dyspnea, palpitation, focal weakness, or edema.  Lab Results  Component Value Date   WBC 6.8 03/11/2014   HGB 11.4 (L) 03/11/2014   HCT 34.9 (L) 03/11/2014   MCV 96.7 03/11/2014   PLT 252.0 03/11/2014   Lab Results  Component Value Date   TSH 0.42 10/27/2010   Review of Systems   Constitutional: Negative for activity change and fatigue.  HENT: Negative for mouth sores, nosebleeds and sore throat.   Eyes: Negative for redness and visual disturbance.  Respiratory: Negative for cough and wheezing.   Gastrointestinal: Negative for nausea and vomiting.       Negative for changes in bowel habits.  Genitourinary: Negative for decreased urine volume and foam in urine. Musculoskeletal: Negative for gait problem and myalgias.  Skin: Negative for rash and wound.  Neurological: Negative for syncope, facial asymmetry, and speech difficulty. Hematological: Negative for adenopathy. Does not bruise/bleed easily.  Psychiatric/Behavioral: Negative for confusion. The patient is not nervous/anxious.    Current Outpatient Medications on File Prior to Visit  Medication Sig Dispense Refill   acetaminophen (TYLENOL) 500 MG tablet Take 1,000 mg by mouth every 6 (six) hours as needed for moderate pain or headache.     No current facility-administered medications on file prior to visit.   Past Medical History:  Diagnosis Date   Proteinuria 2001   by DS. f/u 24 hr urine WNL   Allergies  Allergen Reactions   Sulfa Antibiotics     Per pt SOB and dizziness     Social History   Socioeconomic History   Marital status: Single    Spouse name: Not on file   Number of children: Not on file   Years of education: Not on file   Highest education level: Not on file  Occupational History   Not on file  Tobacco Use   Smoking status: Every Day    Packs/day:  0.25    Types: Cigars, Cigarettes   Smokeless tobacco: Never  Substance and Sexual Activity   Alcohol use: Yes    Alcohol/week: 0.0 standard drinks    Comment: occasional-1x every couple months   Drug use: No   Sexual activity: Not on file  Other Topics Concern   Not on file  Social History Narrative   Parents are divorced   [employer]   Average grades    Father in [place]   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    Living with brother his girlfriend and 2 young children works weekends                  Social Determinants of Corporate investment banker Strain: Not on [employer] Insecurity: Not on file  Transportation Needs: Not on file  Physical Activity: Not on file  Stress: Not on file  Social Connections: Not on file   Vitals:   07/03/21 1519  BP: 120/70  Pulse: (!) 129  Resp: 16  SpO2: 97%   Body mass index is 29.65 kg/m.  Physical Exam  Nursing note and vitals reviewed. Constitutional: She is oriented to person, place, and time. She appears well-developed. No distress.  HENT:  Head: Normocephalic and atraumatic.  Mouth/Throat: Oropharynx is clear and moist and mucous membranes are normal.  Eyes: Conjunctivae are normal.  Cardiovascular: Tachycardia and regular rhythm.  No murmur heard. Pulses:      Dorsalis pedis pulses are 2+ on the right side and 2+ on the left side.  Respiratory: Effort normal and breath sounds normal. No respiratory distress.  GI: Soft. She exhibits no mass. There is no hepatomegaly. There is no abdominal tenderness.  Musculoskeletal:        General: No edema.  Lymphadenopathy:    She has no cervical adenopathy.  Neurological: She is alert and oriented to person, place, and time. She has normal strength. No cranial nerve deficit. Gait normal.  Skin: Skin is warm. No rash noted. No erythema.  Psychiatric: She has a normal mood and affect.  Well groomed, good eye contact.   ASSESSMENT AND PLAN:  Megan Rubio was seen today for urinary tract infection and abdominal pain.  Diagnoses and all orders for this visit: Orders Placed This Encounter  Procedures   Culture, Urine   Comprehensive metabolic panel   CBC   TSH   POCT urinalysis dipstick   POCT urine pregnancy   Lab Results  Component Value Date   WBC 17.9 (H) 07/03/2021   HGB 12.6 07/03/2021   HCT 38.4 07/03/2021   MCV 100.9 (H) 07/03/2021   PLT 217.0 07/03/2021   Lab Results  Component  Value Date   CREATININE 0.92 07/03/2021   BUN 5 (L) 07/03/2021   NA 132 (L) 07/03/2021   K 3.4 (L) 07/03/2021   CL 101 07/03/2021   CO2 20 07/03/2021   Lab Results  Component Value Date   ALT 18 07/03/2021   AST 24 07/03/2021   ALKPHOS 79 07/03/2021   BILITOT 1.1 07/03/2021   Lab Results  Component Value Date   TSH 0.31 (L) 07/03/2021   RUQ abdominal pain Not reproducible with examination today. We discussed possible etiologies. ? Musculoskeletal. Some side effects of NSAID's discussed. Instructed about warning signs. F/U with PCP in 2 weeks.  Dysuria Possible etiologies discussed. Urine dipstick positive for blood ,protein,and large amount of leukocytes. We will follow Ucx.  Urinary tract  infection without hematuria, site unspecified Empiric abx treatment started today and will be tailored according to Ucx results and susceptibility report. Clearly instructed about warning signs. F/U if symptoms persist.  -     nitrofurantoin, macrocrystal-monohydrate, (MACROBID) 100 MG capsule; Take 1 capsule (100 mg total) by mouth 2 (two) times daily for 5 days.  Sinus tachycardia HR 130/min, asymptomatic. Instructed to monitoring for new symptoms, adequate hydration. Instructed about warning signs. Further recommendations according to lab results.   Return in about 2 weeks (around 07/17/2021) for RUQ and sinus tach with PCP.  Kabeer Hoagland G. [place], MD  [facility]. [place] office.

## 2021-07-03 NOTE — Patient Instructions (Signed)
A few things to remember from today's visit:   Dysuria - Plan: POCT urinalysis dipstick, Culture, Urine, POCT urine pregnancy  RUQ abdominal pain - Plan: Comprehensive metabolic panel  Sinus tachycardia - Plan: CBC, TSH  Urinary tract infection without hematuria, site unspecified - Plan: nitrofurantoin, macrocrystal-monohydrate, (MACROBID) 100 MG capsule  If you need refills please call your pharmacy. Do not use My Chart to request refills or for acute issues that need immediate attention.   Adequate fluid intake, avoid holding urine for long hours, and over the counter Vit C OR cranberry capsules might help.  Today we will treat empirically with antibiotic, which we might need to change when urine culture comes back depending of bacteria susceptibility.  Seek immediate medical attention if severe abdominal pain, vomiting, fever/chills, or worsening symptoms. F/U if symptomatic are not any better after 2-3 days of antibiotic treatment.  Please be sure medication list is accurate. If a new problem present, please set up appointment sooner than planned today.

## 2021-07-04 LAB — COMPREHENSIVE METABOLIC PANEL
ALT: 18 U/L (ref 0–35)
AST: 24 U/L (ref 0–37)
Albumin: 4.1 g/dL (ref 3.5–5.2)
Alkaline Phosphatase: 79 U/L (ref 39–117)
BUN: 5 mg/dL — ABNORMAL LOW (ref 6–23)
CO2: 20 mEq/L (ref 19–32)
Calcium: 8.8 mg/dL (ref 8.4–10.5)
Chloride: 101 mEq/L (ref 96–112)
Creatinine, Ser: 0.92 mg/dL (ref 0.40–1.20)
GFR: 86.29 mL/min (ref >60.00)
Glucose, Bld: 124 mg/dL — ABNORMAL HIGH (ref 70–99)
Potassium: 3.4 mEq/L — ABNORMAL LOW (ref 3.5–5.1)
Sodium: 132 mEq/L — ABNORMAL LOW (ref 135–145)
Total Bilirubin: 1.1 mg/dL (ref 0.2–1.2)
Total Protein: 7.2 g/dL (ref 6.0–8.3)

## 2021-07-04 LAB — CBC
HCT: 38.4 % (ref 36.0–46.0)
Hemoglobin: 12.6 g/dL (ref 12.0–15.0)
MCHC: 32.9 g/dL (ref 30.0–36.0)
MCV: 100.9 fl — ABNORMAL HIGH (ref 78.0–100.0)
Platelets: 217 10*3/uL (ref 150.0–400.0)
RBC: 3.8 Mil/uL — ABNORMAL LOW (ref 3.87–5.11)
RDW: 13.1 % (ref 11.5–15.5)
WBC: 17.9 10*3/uL — ABNORMAL HIGH (ref 4.0–10.5)

## 2021-07-04 LAB — TSH: TSH: 0.31 u[IU]/mL — ABNORMAL LOW (ref 0.35–5.50)

## 2021-07-05 LAB — URINE CULTURE
MICRO NUMBER:: 12213906
SPECIMEN QUALITY:: ADEQUATE

## 2021-07-12 NOTE — Addendum Note (Signed)
Addended by: [place], Burhanuddin Kohlmann G on: 07/12/2021 11:01 PM   Modules accepted: Orders

## 2021-09-06 ENCOUNTER — Other Ambulatory Visit: Payer: Self-pay

## 2021-09-06 ENCOUNTER — Encounter: Payer: Self-pay | Admitting: Family Medicine

## 2021-09-06 ENCOUNTER — Ambulatory Visit (INDEPENDENT_AMBULATORY_CARE_PROVIDER_SITE_OTHER): Payer: 59 | Admitting: Family Medicine

## 2021-09-06 VITALS — BP 110/70 | HR 76 | Resp 12 | Ht 68.0 in | Wt 197.0 lb

## 2021-09-06 DIAGNOSIS — E876 Hypokalemia: Secondary | ICD-10-CM

## 2021-09-06 DIAGNOSIS — R739 Hyperglycemia, unspecified: Secondary | ICD-10-CM

## 2021-09-06 DIAGNOSIS — Z113 Encounter for screening for infections with a predominantly sexual mode of transmission: Secondary | ICD-10-CM | POA: Diagnosis not present

## 2021-09-06 LAB — HEMOGLOBIN A1C: Hgb A1c MFr Bld: 5.7 % (ref 4.6–6.5)

## 2021-09-06 LAB — BASIC METABOLIC PANEL
BUN: 8 mg/dL (ref 6–23)
CO2: 25 mEq/L (ref 19–32)
Calcium: 9.2 mg/dL (ref 8.4–10.5)
Chloride: 106 mEq/L (ref 96–112)
Creatinine, Ser: 0.86 mg/dL (ref 0.40–1.20)
GFR: 93.45 mL/min (ref >60.00)
Glucose, Bld: 63 mg/dL — ABNORMAL LOW (ref 70–99)
Potassium: 3.7 mEq/L (ref 3.5–5.1)
Sodium: 138 mEq/L (ref 135–145)

## 2021-09-06 NOTE — Progress Notes (Signed)
ACUTE VISIT Chief Complaint  Patient presents with   std testing   HPI: Megan Rubio is a 25 y.o. female, who is here today requesting STD screening. No known exposure. She is sexually active, she is in a monogamous relation. She has been treated for Chlamydia a couple of years. She uses condoms.  Negative for fever,pelvic pain,vaginal discharge or bleeding,urinary symptoms,or skin rash. LMP 08/21/21.  She was seen on 07/03/21 for RUQ pain, it has resolved. BMP with some abnormalities. She did not follow with PCP.  Lab Results  Component Value Date   CREATININE 0.92 07/03/2021   BUN 5 (L) 07/03/2021   NA 132 (L) 07/03/2021   K 3.4 (L) 07/03/2021   CL 101 07/03/2021   CO2 20 07/03/2021   Glucose was elevated at 124. No hx of diabetes. Negative for abdominal pain, nausea,vomiting, polydipsia,polyuria, or polyphagia.  FHx positive for DM II. She does not exercise regularly and has not been consistent with following a healthful diet. She is planning on decreasing alcohol intake and quitting smoking.  Review of Systems  Constitutional:  Negative for activity change, appetite change and fatigue.  HENT:  Negative for mouth sores and sore throat.   Eyes:  Negative for discharge and redness.  Genitourinary:  Negative for decreased urine volume, dysuria and hematuria.  Musculoskeletal:  Negative for arthralgias and joint swelling.  Neurological:  Negative for syncope, weakness and numbness.  Rest see pertinent positives and negatives per HPI.  Current Outpatient Medications on File Prior to Visit  Medication Sig Dispense Refill   acetaminophen (TYLENOL) 500 MG tablet Take 1,000 mg by mouth every 6 (six) hours as needed for moderate pain or headache.     No current facility-administered medications on file prior to visit.   Past Medical History:  Diagnosis Date   Proteinuria 2001   by DS. f/u 24 hr urine WNL   Allergies  Allergen Reactions   Sulfa Antibiotics     Per  pt SOB and dizziness     Social History   Socioeconomic History   Marital status: Single    Spouse name: Not on file   Number of children: Not on file   Years of education: Not on file   Highest education level: Not on file  Occupational History   Not on file  Tobacco Use   Smoking status: Every Day    Packs/day: 0.25    Types: Cigars, Cigarettes   Smokeless tobacco: Never  Substance and Sexual Activity   Alcohol use: Yes    Alcohol/week: 0.0 standard drinks    Comment: occasional-1x every couple months   Drug use: No   Sexual activity: Not on file  Other Topics Concern   Not on file  Social History Narrative   Parents are divorced   [employer]   Average grades    Father in [place]   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   Living with brother his girlfriend and 2 young children works weekends                  Social Determinants of Corporate investment banker Strain: Not on [employer] Insecurity: Not on file  Transportation Needs: Not on file  Physical Activity: Not on file  Stress: Not on file  Social Connections: Not on file   Vitals:   09/06/21 1058  BP: 110/70  Pulse: 76  Resp: 12   Body mass  index is 29.95 kg/m.  Physical Exam Vitals and nursing note reviewed.  Constitutional:      General: She is not in acute distress.    Appearance: She is well-developed.  HENT:     Head: Normocephalic and atraumatic.     Mouth/Throat:     Mouth: Mucous membranes are moist.     Pharynx: Oropharynx is clear.  Eyes:     Conjunctiva/sclera: Conjunctivae normal.  Cardiovascular:     Rate and Rhythm: Normal rate and regular rhythm.     Heart sounds: No murmur heard. Pulmonary:     Effort: Pulmonary effort is normal. No respiratory distress.     Breath sounds: Normal breath sounds.  Abdominal:     Palpations: Abdomen is soft. There is no hepatomegaly or mass.     Tenderness: There is no abdominal tenderness.  Skin:    General: Skin is warm.      Findings: No erythema or rash.  Neurological:     General: No focal deficit present.     Mental Status: She is alert and oriented to person, place, and time.     Cranial Nerves: No cranial nerve deficit.     Gait: Gait normal.  Psychiatric:     Comments: Well groomed, good eye contact.   ASSESSMENT AND PLAN:  Perrie was seen today for std testing.  Diagnoses and all orders for this visit: Orders Placed This Encounter  Procedures   Basic metabolic panel   Hemoglobin A1c   RPR   HIV Antibody (routine testing w rflx)   Hepatitis C antibody   Lab Results  Component Value Date   CREATININE 0.86 09/06/2021   BUN 8 09/06/2021   NA 138 09/06/2021   K 3.7 09/06/2021   CL 106 09/06/2021   CO2 25 09/06/2021   Lab Results  Component Value Date   HGBA1C 5.7 09/06/2021   Hyperglycemia A healthy life style encouraged for diabetes prevention. Further recommendations according to HgA1C result.  Hypokalemia Mild. K+ rich diet recommended for now. BMP ordered today.  Screen for STD (sexually transmitted disease) Safe sex practices and STD prevention discussed. Continue condom use.  Return if symptoms worsen or fail to improve.   Sanye Ledesma G. [place], MD  [facility]. [place] office.

## 2021-09-06 NOTE — Patient Instructions (Signed)
A few things to remember from today's visit:  Hypokalemia - Plan: Basic metabolic panel  Hyperglycemia - Plan: Hemoglobin A1c  Screen for STD (sexually transmitted disease) - Plan: RPR, HIV Antibody (routine testing w rflx), Urine cytology ancillary only, Hepatitis C antibody  If you need refills please call your pharmacy. Do not use My Chart to request refills or for acute issues that need immediate attention.   Safe Sex Practicing safe sex means taking steps before and during sex to reduce your risk of: Getting an STI (sexually transmitted infection). Giving your partner an STI. Unwanted or unplanned pregnancy. How to practice safe sex Ways you can practice safe sex  Limit your sexual partners to only one partner who is having sex with only you. Avoid using alcohol and drugs before having sex. Alcohol and drugs can affect your judgment. Before having sex with a new partner: Talk to your partner about past partners, past STIs, and drug use. Get screened for STIs and discuss the results with your partner. Ask your partner to get screened too. Check your body regularly for sores, blisters, rashes, or unusual discharge. If you notice any of these problems, visit your health care provider. Avoid sexual contact if you have symptoms of an infection or you are being treated for an STI. While having sex, use a condom. Make sure to: Use a condom every time you have vaginal, oral, or anal sex. Both females and males should wear condoms during oral sex. Keep condoms in place from the beginning to the end of sexual activity. Use a latex condom, if possible. Latex condoms offer the best protection. Use only water-based lubricants with a condom. Using petroleum-based lubricants or oils will weaken the condom and increase the chance that it will break. Ways your health care provider can help you practice safe sex  See your health care provider for regular screenings, exams, and tests for  STIs. Talk with your health care provider about what kind of birth control (contraception) is best for you. Get vaccinated against hepatitis B and human papillomavirus (HPV). If you are at risk of being infected with HIV (human immunodeficiency virus), talk with your health care provider about taking a prescription medicine to prevent HIV infection. You are at risk for HIV if you: Are a man who has sex with other men. Are sexually active with more than one partner. Take drugs by injection. Have a sex partner who has HIV. Have unprotected sex. Have sex with someone who has sex with both men and women. Have had an STI. Follow these instructions at home: Take over-the-counter and prescription medicines only as told by your health care provider. Keep all follow-up visits. This is important. Where to find more information Centers for Disease Control and Prevention: FootballExhibition.com.br Planned Parenthood: www.plannedparenthood.org Office on [employer] Health: http://hoffman.com/ Summary Practicing safe sex means taking steps before and during sex to reduce your risk getting an STI, giving your partner an STI, and having an unwanted or unplanned pregnancy. Before having sex with a new partner, talk to your partner about past partners, past STIs, and drug use. Use a condom every time you have vaginal, oral, or anal sex. Both females and males should wear condoms during oral sex. Check your body regularly for sores, blisters, rashes, or unusual discharge. If you notice any of these problems, visit your health care provider. See your health care provider for regular screenings, exams, and tests for STIs. This information is not intended to replace advice given to  you by your health care provider. Make sure you discuss any questions you have with your health care provider. Document Revised: 04/18/2020 Document Reviewed: 04/18/2020 Elsevier Patient Education  2022 Elsevier Inc.  Please be sure medication list  is accurate. If a new problem present, please set up appointment sooner than planned today.

## 2021-09-07 LAB — HEPATITIS C ANTIBODY
Hepatitis C Ab: NONREACTIVE
SIGNAL TO CUT-OFF: 0.06 (ref <1.00)

## 2021-09-07 LAB — RPR: RPR Ser Ql: NONREACTIVE

## 2021-09-07 LAB — HIV ANTIBODY (ROUTINE TESTING W REFLEX): HIV 1&2 Ab, 4th Generation: NONREACTIVE

## 2022-02-21 NOTE — Progress Notes (Deleted)
? ? ?  ACUTE VISIT ?No chief complaint on file. ? ?HPI: ?[name] is a 26 y.o. female, who is here today complaining of *** ?HPI ? ?Review of Systems ?Rest see pertinent positives and negatives per HPI. ? ?Current Outpatient Medications on File Prior to Visit  ?Medication Sig Dispense Refill  ? acetaminophen (TYLENOL) 500 MG tablet Take 1,000 mg by mouth every 6 (six) hours as needed for moderate pain or headache.    ? ?No current facility-administered medications on file prior to visit.  ? ? ? ?Past Medical History:  ?Diagnosis Date  ? Proteinuria 2001  ? by DS. f/u 24 hr urine WNL  ? ?Allergies  ?Allergen Reactions  ? Sulfa Antibiotics   ?  Per pt SOB and dizziness   ? ? ?Social History  ? ?Socioeconomic History  ? Marital status: Single  ?  Spouse name: Not on file  ? Number of children: Not on file  ? Years of education: Not on file  ? Highest education level: 12th grade  ?Occupational History  ? Not on file  ?Tobacco Use  ? Smoking status: Every Day  ?  Packs/day: 0.25  ?  Types: Cigars, Cigarettes  ? Smokeless tobacco: Never  ?Substance and Sexual Activity  ? Alcohol use: Yes  ?  Alcohol/week: 0.0 standard drinks  ?  Comment: occasional-1x every couple months  ? Drug use: No  ? Sexual activity: Not on file  ?Other Topics Concern  ? Not on file  ?Social History Narrative  ? Parents are divorced  ? [employer]  ? Average grades   ? Father in [place]  ? Working food Leisure centre manager.    ? 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  ? Living with brother his girlfriend and 2 young children works weekends  ?   ?   ?   ?   ?   ? ?Social Determinants of Health  ? ?Financial Resource Strain: Low Risk   ? Difficulty of Paying Living Expenses: Not very hard  ?Food Insecurity: No Food Insecurity  ? Worried About Programme researcher, broadcasting/film/video in the Last Year: Never true  ? Ran Out of Food in the Last Year: Never true  ?Transportation Needs: No Transportation Needs  ? Lack of Transportation (Medical): No  ? Lack of Transportation (Non-Medical): No  ?Physical  Activity: Sufficiently Active  ? Days of Exercise per Week: 5 days  ? Minutes of Exercise per Session: 60 min  ?Stress: No Stress Concern Present  ? Feeling of Stress : Not at all  ?Social Connections: Moderately Isolated  ? Frequency of Communication with Friends and Family: More than three times a week  ? Frequency of Social Gatherings with Friends and Family: Twice a week  ? Attends Religious Services: 1 to 4 times per year  ? Active Member of Clubs or Organizations: No  ? Attends Banker Meetings: Not on file  ? Marital Status: Never married  ? ? ?There were no vitals filed for this visit. ?There is no height or weight on file to calculate BMI. ? ?Physical Exam ? ?ASSESSMENT AND PLAN: ? ?There are no diagnoses linked to this encounter. ? ? ?No follow-ups on file. ? ? ?Betty G. [place], MD ? ?[facility]. ?[place] office. ? ?Discharge Instructions   ?None ?  ? ? ? ? ? ? ? ? ? ? ? ? ?

## 2022-02-23 ENCOUNTER — Ambulatory Visit: Payer: Self-pay | Admitting: Family Medicine

## 2022-06-13 ENCOUNTER — Ambulatory Visit: Payer: Self-pay | Admitting: Family Medicine

## 2022-06-13 NOTE — Progress Notes (Deleted)
ACUTE VISIT No chief complaint on file.  HPI: [name] is a 26 y.o. female, who is here today complaining of *** HPI  Review of Systems Rest see pertinent positives and negatives per HPI.  Current Outpatient Medications on File Prior to Visit  Medication Sig Dispense Refill  . acetaminophen (TYLENOL) 500 MG tablet Take 1,000 mg by mouth every 6 (six) hours as needed for moderate pain or headache.     No current facility-administered medications on file prior to visit.     Past Medical History:  Diagnosis Date  . Proteinuria 2001   by DS. f/u 24 hr urine WNL   Allergies  Allergen Reactions  . Sulfa Antibiotics     Per pt SOB and dizziness     Social History   Socioeconomic History  . Marital status: Single    Spouse name: Not on file  . Number of children: Not on file  . Years of education: Not on file  . Highest education level: 12th grade  Occupational History  . Not on file  Tobacco Use  . Smoking status: Every Day    Packs/day: 0.25    Types: Cigars, Cigarettes  . Smokeless tobacco: Never  Substance and Sexual Activity  . Alcohol use: Yes    Alcohol/week: 0.0 standard drinks of alcohol    Comment: occasional-1x every couple months  . Drug use: No  . Sexual activity: Not on file  Other Topics Concern  . 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   Living with brother his girlfriend and 2 young children works weekends                  Social Determinants of Corporate investment banker Strain: Low Risk  (02/21/2022)   Overall Financial Resource Strain (CARDIA)   . Difficulty of Paying Living Expenses: Not very hard  Food Insecurity: No Food Insecurity (02/21/2022)   Hunger Vital Sign   . Worried About Programme researcher, broadcasting/film/video in the Last Year: Never true   . Ran Out of Food in the Last Year: Never true  Transportation  Needs: No Transportation Needs (02/21/2022)   PRAPARE - Transportation   . Lack of Transportation (Medical): No   . Lack of Transportation (Non-Medical): No  Physical Activity: Sufficiently Active (02/21/2022)   Exercise Vital Sign   . Days of Exercise per Week: 5 days   . Minutes of Exercise per Session: 60 min  Stress: No Stress Concern Present (02/21/2022)   [employer] of Occupational Health - Occupational Stress Questionnaire   . Feeling of Stress : Not at all  Social Connections: Moderately Isolated (02/21/2022)   Social Connection and Isolation Panel [NHANES]   . Frequency of Communication with Friends and Family: More than three times a week   . Frequency of Social Gatherings with Friends and Family: Twice a week   . Attends Religious Services: 1 to 4 times per year   . Active Member of Clubs or Organizations: No   . Attends Banker Meetings: Not on file   . Marital Status: Never married    There were no vitals filed for this visit. There is no height or weight on file to calculate BMI.  Physical Exam  ASSESSMENT AND PLAN:  There are no diagnoses linked to this encounter.   No follow-ups on file.   Betty G. [place], MD  [facility]. [place] office.  Discharge Instructions   None

## 2022-12-06 ENCOUNTER — Emergency Department (HOSPITAL_BASED_OUTPATIENT_CLINIC_OR_DEPARTMENT_OTHER)
Admission: EM | Admit: 2022-12-06 | Discharge: 2022-12-06 | Disposition: A | Discharge status: Home / Self Care | Payer: Self-pay | Attending: Emergency Medicine | Admitting: Emergency Medicine

## 2022-12-06 ENCOUNTER — Encounter (HOSPITAL_BASED_OUTPATIENT_CLINIC_OR_DEPARTMENT_OTHER): Payer: Self-pay | Admitting: Emergency Medicine

## 2022-12-06 DIAGNOSIS — L02214 Cutaneous abscess of groin: Secondary | ICD-10-CM | POA: Insufficient documentation

## 2022-12-06 DIAGNOSIS — L0291 Cutaneous abscess, unspecified: Secondary | ICD-10-CM

## 2022-12-06 MED ORDER — DOXYCYCLINE HYCLATE 100 MG PO CAPS: 100.0000 mg | ORAL_CAPSULE | Freq: Two times a day (BID) | ORAL | 0 refills | Status: DC

## 2022-12-06 NOTE — Discharge Instructions (Signed)
I would have you use a warm compress, get a washcloth wet put it in the microwave make sure it does not get a burn you hold it there for 10 to 15 minutes at a time at least 4 times a day.  Please return for rapid spreading redness or if you develop a fever.  Follow-up with your family doctor in the office.

## 2022-12-06 NOTE — ED Notes (Signed)
ED Provider at bedside. 

## 2022-12-06 NOTE — ED Notes (Signed)
EDP in room to assess abscess  When pt removed the washcloth she was holding over the abscess large amount of brown drainage noted  Able to express more drainage for abscess  Pt tolerated well

## 2022-12-06 NOTE — ED Triage Notes (Signed)
Pt  states she thinks she has an ingrown hair in her left groin area  Pt states it started on Monday and has progressively gotten worse

## 2022-12-06 NOTE — ED Notes (Signed)
Rx x 1 given  Written and verbal inst to pt  Verbalized an understanding  To home  

## 2022-12-06 NOTE — ED Provider Notes (Signed)
[facility] [place] EMERGENCY DEPARTMENT Provider Note   CSN: [id number] Arrival date & time: 12/06/22  9767     History  Chief Complaint  Patient presents with   Abscess    Megan Rubio is a 27 y.o. female.  27 yo F with a chief complaints of a abscess to the left groin.  Is been going on for about 3 to 4 days.  No fevers at home.  Has gotten progressively larger and more painful.   Abscess      Home Medications Prior to Admission medications   Medication Sig Start Date End Date Taking? Authorizing Provider  doxycycline (VIBRAMYCIN) 100 MG capsule Take 1 capsule (100 mg total) by mouth 2 (two) times daily. One po bid x 7 days 12/06/22  Yes Deno Etienne, DO  acetaminophen (TYLENOL) 500 MG tablet Take 1,000 mg by mouth every 6 (six) hours as needed for moderate pain or headache.    [provider]      Allergies    Sulfa antibiotics    Review of Systems   Review of Systems  Physical Exam Updated Vital Signs BP (!) 136/92 (BP Location: Left Arm)   Pulse (!) 102   Temp 99.3 F (37.4 C) (Oral)   Resp 16   Ht 5\' 8"  (1.727 m)   Wt 87.1 kg   LMP 12/01/2022 (Exact Date)   SpO2 100%   BMI 29.19 kg/m  Physical Exam Vitals and nursing note reviewed.  Constitutional:      General: She is not in acute distress.    Appearance: She is well-developed. She is not diaphoretic.  HENT:     Head: Normocephalic and atraumatic.  Eyes:     Pupils: Pupils are equal, round, and reactive to light.  Cardiovascular:     Rate and Rhythm: Normal rate and regular rhythm.     Heart sounds: No murmur heard.    No friction rub. No gallop.  Pulmonary:     Effort: Pulmonary effort is normal.     Breath sounds: No wheezing or rales.  Abdominal:     General: There is no distension.     Palpations: Abdomen is soft.     Tenderness: There is no abdominal tenderness.  Musculoskeletal:        General: No tenderness.     Cervical back: Normal range of motion and neck supple.      Comments: Left inner thigh with area of fluctuance and purulent drainage.  Skin:    General: Skin is warm and dry.  Neurological:     Mental Status: She is alert and oriented to person, place, and time.  Psychiatric:        Behavior: Behavior normal.     ED Results / Procedures / Treatments   Labs (all labs ordered are listed, but only abnormal results are displayed) Labs Reviewed - No data to display  EKG None  Radiology No results found.  Procedures Procedures    Medications Ordered in ED Medications - No data to display  ED Course/ Medical Decision Making/ A&P                           Medical Decision Making Risk Prescription drug management.   27 yo F with a chief complaint of an abscess to the left groin.  This has been there for about 3 to 4 days.  Is gotten progressively worse.  Actively draining upon arrival here.  Discussed risk and benefits of I&D.  Current electing for warm compresses at home and a course of antibiotics.  PCP follow-up.  6:34 AM:  I have discussed the diagnosis/risks/treatment options with the patient.  Evaluation and diagnostic testing in the emergency department does not suggest an emergent condition requiring admission or immediate intervention beyond what has been performed at this time.  They will follow up with PCP. We also discussed returning to the ED immediately if new or worsening sx occur. We discussed the sx which are most concerning (e.g., sudden worsening pain, fever, inability to tolerate by mouth, rapid spreading redness) that necessitate immediate return. Medications administered to the patient during their visit and any new prescriptions provided to the patient are listed below.  Medications given during this visit Medications - No data to display   The patient appears reasonably screen and/or stabilized for discharge and I doubt any other medical condition or other [facility] requiring further screening, evaluation, or  treatment in the ED at this time prior to discharge.          Final Clinical Impression(s) / ED Diagnoses Final diagnoses:  Abscess    Rx / DC Orders ED Discharge Orders          Ordered    doxycycline (VIBRAMYCIN) 100 MG capsule  2 times daily        12/06/22 [place], [place], DO 12/06/22 [phone]

## 2023-01-28 ENCOUNTER — Emergency Department (HOSPITAL_BASED_OUTPATIENT_CLINIC_OR_DEPARTMENT_OTHER): Payer: Self-pay

## 2023-01-28 ENCOUNTER — Other Ambulatory Visit: Payer: Self-pay

## 2023-01-28 ENCOUNTER — Emergency Department (HOSPITAL_BASED_OUTPATIENT_CLINIC_OR_DEPARTMENT_OTHER)
Admission: EM | Admit: 2023-01-28 | Discharge: 2023-01-28 | Disposition: A | Discharge status: Home / Self Care | Payer: Self-pay | Attending: Emergency Medicine | Admitting: Emergency Medicine

## 2023-01-28 DIAGNOSIS — R0600 Dyspnea, unspecified: Secondary | ICD-10-CM | POA: Insufficient documentation

## 2023-01-28 DIAGNOSIS — R002 Palpitations: Secondary | ICD-10-CM | POA: Insufficient documentation

## 2023-01-28 DIAGNOSIS — R0602 Shortness of breath: Secondary | ICD-10-CM | POA: Insufficient documentation

## 2023-01-28 DIAGNOSIS — R079 Chest pain, unspecified: Secondary | ICD-10-CM | POA: Insufficient documentation

## 2023-01-28 DIAGNOSIS — Z1152 Encounter for screening for COVID-19: Secondary | ICD-10-CM | POA: Insufficient documentation

## 2023-01-28 LAB — CBC WITH DIFFERENTIAL/PLATELET
Abs Immature Granulocytes: 0.01 K/uL (ref 0.00–0.07)
Basophils Absolute: 0.1 K/uL (ref 0.0–0.1)
Basophils Relative: 1 %
Eosinophils Absolute: 0.2 K/uL (ref 0.0–0.5)
Eosinophils Relative: 3 %
HCT: 36.8 % (ref 36.0–46.0)
Hemoglobin: 12.2 g/dL (ref 12.0–15.0)
Immature Granulocytes: 0 %
Lymphocytes Relative: 46 %
Lymphs Abs: 4 K/uL (ref 0.7–4.0)
MCH: 32.7 pg (ref 26.0–34.0)
MCHC: 33.2 g/dL (ref 30.0–36.0)
MCV: 98.7 fL (ref 80.0–100.0)
Monocytes Absolute: 0.6 K/uL (ref 0.1–1.0)
Monocytes Relative: 7 %
Neutro Abs: 3.7 K/uL (ref 1.7–7.7)
Neutrophils Relative %: 43 %
Platelets: 238 K/uL (ref 150–400)
RBC: 3.73 MIL/uL — ABNORMAL LOW (ref 3.87–5.11)
RDW: 13.1 % (ref 11.5–15.5)
WBC: 8.7 K/uL (ref 4.0–10.5)
nRBC: 0 % (ref 0.0–0.2)

## 2023-01-28 LAB — HCG, SERUM, QUALITATIVE: Preg, Serum: NEGATIVE

## 2023-01-28 LAB — BASIC METABOLIC PANEL WITH GFR
Anion gap: 4 — ABNORMAL LOW (ref 5–15)
BUN: 12 mg/dL (ref 6–20)
CO2: 24 mmol/L (ref 22–32)
Calcium: 8.5 mg/dL — ABNORMAL LOW (ref 8.9–10.3)
Chloride: 108 mmol/L (ref 98–111)
Creatinine, Ser: 0.99 mg/dL (ref 0.44–1.00)
GFR, Estimated: >60 mL/min
Glucose, Bld: 94 mg/dL (ref 70–99)
Potassium: 3.4 mmol/L — ABNORMAL LOW (ref 3.5–5.1)
Sodium: 136 mmol/L (ref 135–145)

## 2023-01-28 LAB — TSH: TSH: 0.813 u[IU]/mL (ref 0.350–4.500)

## 2023-01-28 LAB — RESP PANEL BY RT-PCR (RSV, FLU A&B, COVID)  RVPGX2
Influenza A by PCR: NEGATIVE
Influenza B by PCR: NEGATIVE
Resp Syncytial Virus by PCR: NEGATIVE
SARS Coronavirus 2 by RT PCR: NEGATIVE

## 2023-01-28 NOTE — ED Provider Notes (Signed)
[place] [facility] [facility] Provider Note   CSN: [id number] Arrival date & time: 01/28/23  0106     History  Chief Complaint  Patient presents with   Shortness of Breath    Megan Rubio is a 27 y.o. female.  Patient is a 27 year old female presenting with a 3-day history of intermittent episodes of palpitations and shortness of breath.  This has occurred several times per day for the past few days.  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  She denies to me she is having any chest pain.  She denies any leg swelling.  The history is provided by the patient.       Home Medications Prior to Admission medications   Medication Sig Start Date End Date Taking? Authorizing Provider  acetaminophen (TYLENOL) 500 MG tablet Take 1,000 mg by mouth every 6 (six) hours as needed for moderate pain or headache.    [provider]  doxycycline (VIBRAMYCIN) 100 MG capsule Take 1 capsule (100 mg total) by mouth 2 (two) times daily. One po bid x 7 days 12/06/22   Deno Etienne, DO      Allergies    Sulfa antibiotics    Review of Systems   Review of Systems  All other systems reviewed and are negative.   Physical Exam Updated Vital Signs BP (!) 160/96   Pulse 98   Temp 98.5 F (36.9 C)   Resp 18   Ht '5\' 8"'$  (1.727 m)   Wt 86.2 kg   LMP 01/21/2023 (Approximate)   SpO2 100%   BMI 28.89 kg/m  Physical Exam Vitals and nursing note reviewed.  Constitutional:      General: She is not in acute distress.    Appearance: She is well-developed. She is not diaphoretic.  HENT:     Head: Normocephalic and atraumatic.     Mouth/Throat:     Mouth: Mucous membranes are moist.     Pharynx: No pharyngeal swelling or oropharyngeal exudate.  Cardiovascular:     Rate and Rhythm: Normal rate and regular rhythm.     Heart sounds: No murmur heard.    No friction rub. No gallop.  Pulmonary:      Effort: Pulmonary effort is normal. No respiratory distress.     Breath sounds: Normal breath sounds. No wheezing.  Abdominal:     General: Bowel sounds are normal. There is no distension.     Palpations: Abdomen is soft.     Tenderness: There is no abdominal tenderness.  Musculoskeletal:        General: Normal range of motion.     Cervical back: Normal range of motion and neck supple.  Skin:    General: Skin is warm and dry.  Neurological:     General: No focal deficit present.     Mental Status: She is alert and oriented to person, place, and time.     ED Results / Procedures / Treatments   Labs (all labs ordered are listed, but only abnormal results are displayed) Labs Reviewed - No data to display  EKG EKG Interpretation  Date/Time:  Monday January 28 2023 01:40:59 EST Ventricular Rate:  85 PR Interval:  152 QRS Duration: 90 QT Interval:  355 QTC Calculation: 423 R Axis:   48 Text Interpretation: Sinus rhythm Normal ECG Confirmed by Veryl Speak [id number] on 01/28/2023 1:42:50 AM  Radiology No results found.  Procedures Procedures    Medications Ordered in ED Medications - No data to display  ED Course/ Medical Decision Making/ A&P  Patient presenting here with several episodes of dizziness, shortness of breath, and palpitations that have been occurring over the past several days.  She arrives here with stable vital signs, no fever, and no hypoxia.  She is clinically well-appearing and physical examination is basically unremarkable.  Workup initiated including CBC, metabolic panel, troponin, and hCG.  All studies basically unremarkable and provide no explanation for her symptoms.  Chest x-ray is clear.  Patient's EKG is showing a normal sinus rhythm and cardiac monitoring has revealed no ectopy during her 2-hour ED visit.  She appears to be feeling better.  I am uncertain as to the exact etiology of these episodes, but nothing appears emergent.  I will have her  rest, avoid caffeine, drink plenty of fluids, and follow-up with primary doctor if symptoms persist.  Final Clinical Impression(s) / ED Diagnoses Final diagnoses:  None    Rx / DC Orders ED Discharge Orders     None         Veryl Speak, MD 01/28/23 [id number]

## 2023-01-28 NOTE — Discharge Instructions (Signed)
Drink plenty of fluids and get plenty of rest.  Avoid caffeine.  Follow-up with primary doctor if symptoms persist, and return to the ER if symptoms significantly worsen or change.

## 2023-01-28 NOTE — ED Triage Notes (Signed)
Pt reports intermittent episodes of feeling short of breath with trouble breathing "like something is in my throat making it hard to swallow." Pt denies any symptoms at present and reports feeling normal until she has the episodes.

## 2023-02-11 NOTE — Progress Notes (Deleted)
ACUTE VISIT No chief complaint on file.  HPI: Megan Rubio is a 27 y.o. female, who is here today complaining of *** HPI  Review of Systems See other pertinent positives and negatives in HPI.  Current Outpatient Medications on File Prior to Visit  Medication Sig Dispense Refill   acetaminophen (TYLENOL) 500 MG tablet Take 1,000 mg by mouth every 6 (six) hours as needed for moderate pain or headache.     doxycycline (VIBRAMYCIN) 100 MG capsule Take 1 capsule (100 mg total) by mouth 2 (two) times daily. One po bid x 7 days 14 capsule 0   No current facility-administered medications on file prior to visit.    Past Medical History:  Diagnosis Date   Proteinuria 2001   by DS. f/u 24 hr urine WNL   Allergies  Allergen Reactions   Sulfa Antibiotics     Per pt SOB and dizziness     Social History   Socioeconomic History   Marital status: Single    Spouse name: Not on file   Number of children: Not on file   Years of education: Not on file   Highest education level: 12th grade  Occupational History   Not on file  Tobacco Use   Smoking status: Every Day    Packs/day: .25    Types: Cigars, Cigarettes   Smokeless tobacco: Never  Vaping Use   Vaping Use: Never used  Substance and Sexual Activity   Alcohol use: Yes    Alcohol/week: 0.0 standard drinks of alcohol    Comment: occasional-1x every couple months   Drug use: No   Sexual activity: Yes    Birth control/protection: None  Other Topics Concern   Not on file  Social History Narrative   Parents are divorced   [employer]   Average grades    Father in [employer]   Working food Cytogeneticist.     Neg tad.   Living with brother his girlfriend and 2 young children works weekends                  Social Determinants of Radio broadcast assistant Strain: Low Risk  (02/21/2022)   Overall Financial Resource Strain (CARDIA)    Difficulty of Paying Living Expenses: Not very hard  Food Insecurity: No Food  Insecurity (02/21/2022)   Hunger Vital Sign    Worried About Running Out of Food in the Last Year: Never true    Ran Out of Food in the Last Year: Never true  Transportation Needs: No Transportation Needs (02/21/2022)   PRAPARE - Hydrologist (Medical): No    Lack of Transportation (Non-Medical): No  Physical Activity: Sufficiently Active (02/21/2022)   Exercise Vital Sign    Days of Exercise per Week: 5 days    Minutes of Exercise per Session: 60 min  Stress: No Stress Concern Present (02/21/2022)   [place]    Feeling of Stress : Not at all  Social Connections: Moderately Isolated (02/21/2022)   Social Connection and Isolation Panel [NHANES]    Frequency of Communication with Friends and Family: More than three times a week    Frequency of Social Gatherings with Friends and Family: Twice a week    Attends Religious Services: 1 to 4 times per year    Active Member of [employer] or Organizations: No    Attends Archivist Meetings: Not  on file    Marital Status: Never married    There were no vitals filed for this visit. There is no height or weight on file to calculate BMI.  Physical Exam  ASSESSMENT AND PLAN: There are no diagnoses linked to this encounter.  No follow-ups on file.  Betty G. [place], MD  [facility]. [place] office.  Discharge Instructions   None

## 2023-02-12 ENCOUNTER — Ambulatory Visit: Payer: Self-pay | Admitting: Family Medicine

## 2023-05-28 ENCOUNTER — Ambulatory Visit (INDEPENDENT_AMBULATORY_CARE_PROVIDER_SITE_OTHER): Payer: Self-pay | Admitting: Family Medicine

## 2023-05-28 ENCOUNTER — Encounter: Payer: Self-pay | Admitting: Family Medicine

## 2023-05-28 VITALS — BP 120/70 | HR 87 | Temp 98.5°F | Resp 12 | Ht 68.0 in | Wt 200.1 lb

## 2023-05-28 DIAGNOSIS — D509 Iron deficiency anemia, unspecified: Secondary | ICD-10-CM

## 2023-05-28 DIAGNOSIS — K219 Gastro-esophageal reflux disease without esophagitis: Secondary | ICD-10-CM

## 2023-05-28 DIAGNOSIS — K5900 Constipation, unspecified: Secondary | ICD-10-CM

## 2023-05-28 MED ORDER — OMEPRAZOLE 40 MG PO CPDR: 40.0000 mg | DELAYED_RELEASE_CAPSULE | Freq: Every day | ORAL | 0 refills | Status: DC

## 2023-05-28 NOTE — Patient Instructions (Addendum)
A few things to remember from today's visit:  Gastroesophageal reflux disease, unspecified whether esophagitis present - Plan: omeprazole (PRILOSEC) 40 MG capsule  Constipation, unspecified constipation type  Iron deficiency anemia, unspecified iron deficiency anemia type Start taking omeprazole 40 mg 30-minute before breakfast for a week, then you can decrease dose to 20 mg daily. Avoid foods that can irritate your stomach. If symptoms are not any better in 2 weeks, please let me know so we can change medication. Increase water intake and be sure to eat appropriate amount of fiber. You can take iron supplementation every other day with vitamin C.  If you need refills for medications you take chronically, please call your pharmacy. Do not use My Chart to request refills or for acute issues that need immediate attention. If you send a my chart message, it may take a few days to be addressed, specially if I am not in the office.  Please be sure medication list is accurate. If a new problem present, please set up appointment sooner than planned today.

## 2023-05-28 NOTE — Progress Notes (Unsigned)
ACUTE VISIT Chief Complaint  Patient presents with   Gastroesophageal Reflux    Using tums, happens every time she eats or drinks something.    HPI: [name] is a 27 y.o. female, who is here today complaining of heartburn and regurgitation when trying to burp, which have persisted for the past two to three weeks.   She reports experiencing an epigastric aching pain and a burning sensation in the chest. She denies any changes in diet or stress levels prior to the onset of these symptoms.  Abdominal Pain This is a new problem. The current episode started 1 to 4 weeks ago. The onset quality is gradual. The problem occurs intermittently. The problem has been gradually improving. The pain is mild. The abdominal pain does not radiate. Associated symptoms include belching. Pertinent negatives include no arthralgias, fever, frequency, headaches, hematuria, melena, myalgias or vomiting. The pain is aggravated by eating. The pain is relieved by Belching. She has tried antacids for the symptoms. The treatment provided mild relief. There is no history of abdominal surgery, irritable bowel syndrome, PUD or ulcerative colitis.   She also reports occasional nausea and recent changes in bowel habits. Initially, she experienced constipation for two weeks, followed by softer, non-watery stools.  She denies any blood in the stool or melena. No prior hx of constipation. She mentions having a friend who had similar stomach issues but is unsure if their condition could be related.  She also mentions that she has been experiencing "brain fogginess and zoning out" for 2-3 weeks. She denies any feelings of depression or worthlessness and reports sleeping an average of ten hours per night. Negative for associated headache, visual changes, or focal neurologic deficit.  History of iron deficiency anemia and has been taking iron pills intermittently for the past couple of months. She reports no heavy  menstrual bleeding. Lab Results  Component Value Date   WBC 8.7 01/28/2023   HGB 12.2 01/28/2023   HCT 36.8 01/28/2023   MCV 98.7 01/28/2023   PLT 238 01/28/2023   Mild hypoK+ noted in 01/2023 during ED visit. Lab Results  Component Value Date   CREATININE 0.99 01/28/2023   BUN 12 01/28/2023   NA 136 01/28/2023   K 3.4 (L) 01/28/2023   CL 108 01/28/2023   CO2 24 01/28/2023   Lab Results  Component Value Date   TSH 0.813 01/28/2023   Review of Systems  Constitutional:  Negative for activity change, appetite change and fever.  HENT:  Negative for mouth sores, nosebleeds and sore throat.   Respiratory:  Negative for cough, shortness of breath and wheezing.   Cardiovascular:  Negative for chest pain, palpitations and leg swelling.  Gastrointestinal:  Positive for abdominal pain. Negative for melena and vomiting.  Endocrine: Negative for cold intolerance and heat intolerance.  Genitourinary:  Negative for decreased urine volume, frequency and hematuria.  Musculoskeletal:  Negative for arthralgias and myalgias.  Neurological:  Negative for syncope and headaches.  Psychiatric/Behavioral:  Negative for confusion and hallucinations.   See other pertinent positives and negatives in HPI.  Current Outpatient Medications on File Prior to Visit  Medication Sig Dispense Refill   acetaminophen (TYLENOL) 500 MG tablet Take 1,000 mg by mouth every 6 (six) hours as needed for moderate pain or headache.     No current facility-administered medications on file prior to visit.   Past Medical History:  Diagnosis Date   Proteinuria 2001   by DS. f/u 24 hr urine WNL  Allergies  Allergen Reactions   Sulfa Antibiotics     Per pt SOB and dizziness     Social History   Socioeconomic History   Marital status: Single    Spouse name: Not on file   Number of children: Not on file   Years of education: Not on file   Highest education level: 12th grade  Occupational History   Not on file   Tobacco Use   Smoking status: Former    Packs/day: .25    Types: Cigars, Cigarettes   Smokeless tobacco: Never  Vaping Use   Vaping Use: Never used  Substance and Sexual Activity   Alcohol use: Yes    Alcohol/week: 0.0 standard drinks of alcohol    Comment: occasional-1x every couple months   Drug use: No   Sexual activity: Yes    Birth control/protection: None  Other Topics Concern   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   Living with brother his girlfriend and 2 young children works weekends                  Social Determinants of Corporate investment banker Strain: Low Risk  (05/27/2023)   Overall Financial Resource Strain (CARDIA)    Difficulty of Paying Living Expenses: Not very hard  Food Insecurity: No Food Insecurity (05/27/2023)   Hunger Vital Sign    Worried About Running Out of Food in the Last Year: Never true    Ran Out of Food in the Last Year: Never true  Transportation Needs: No Transportation Needs (05/27/2023)   PRAPARE - Administrator, Civil Service (Medical): No    Lack of Transportation (Non-Medical): No  Physical Activity: Sufficiently Active (05/27/2023)   Exercise Vital Sign    Days of Exercise per Week: 5 days    Minutes of Exercise per Session: 90 min  Stress: No Stress Concern Present (05/27/2023)   [employer] of Occupational Health - Occupational Stress Questionnaire    Feeling of Stress : Not at all  Social Connections: Unknown (05/27/2023)   Social Connection and Isolation Panel [NHANES]    Frequency of Communication with Friends and Family: More than three times a week    Frequency of Social Gatherings with Friends and Family: More than three times a week    Attends Religious Services: More than 4 times per year    Active Member of [employer] or Organizations: Not on file    Attends Banker  Meetings: Not on file    Marital Status: Never married    Vitals:   05/28/23 0902  BP: 120/70  Pulse: 87  Resp: 12  Temp: 98.5 F (36.9 C)  SpO2: 98%   Body mass index is 30.43 kg/m.  Physical Exam Vitals and nursing note reviewed.  Constitutional:      General: She is not in acute distress.    Appearance: She is well-developed.  HENT:     Head: Normocephalic and atraumatic.     Mouth/Throat:     Mouth: Mucous membranes are moist.     Pharynx: Oropharynx is clear.  Eyes:     Conjunctiva/sclera: Conjunctivae normal.  Cardiovascular:     Rate and Rhythm: Normal rate and regular rhythm.     Heart sounds: No murmur heard.  Pulmonary:     Effort: Pulmonary effort is normal. No respiratory distress.     Breath sounds: Normal breath sounds.  Abdominal:     Palpations: Abdomen is soft. There is no hepatomegaly or mass.     Tenderness: There is no abdominal tenderness.  Lymphadenopathy:     Cervical: No cervical adenopathy.  Skin:    General: Skin is warm.     Findings: No erythema or rash.  Neurological:     General: No focal deficit present.     Mental Status: She is alert and oriented to person, place, and time.     Cranial Nerves: No cranial nerve deficit.     Gait: Gait normal.  Psychiatric:        Mood and Affect: Mood and affect normal.   ASSESSMENT AND PLAN:  Elize was seen today for GI symptoms.  Constipation, unspecified constipation type For 2 weeks followed by episode of diarrhea, symptoms have resolved. Recommend adequate fiber and fluid intake. I will obtain blood work is needed at this time.  Mild hypokalemia seen in 01/2023, recommend potassium rich diet.  Gastroesophageal reflux disease, unspecified whether esophagitis present We discussed diagnosis, prognosis, and treatment options. Recommend omeprazole 40 mg daily 30 minutes before breakfast for 8 weeks, then she can decrease dose to 20 mg daily. If problem does not improve in 2 weeks, we can  change to Protonix. GERD precautions recommended.  -     Omeprazole; Take 1 capsule (40 mg total) by mouth daily.  Dispense: 60 capsule; Refill: 0  Iron deficiency anemia, unspecified iron deficiency anemia type Last CBC done in 01/2023 showed improvement. Currently on iron supplementation, which could be aggravating GI symptoms. Recommend continuing iron supplementation but instead daily she can take it every other day with vitamin C.  Return if symptoms worsen or fail to improve.  Lanie Schelling G. [place], MD  [facility]. [place] office.

## 2023-06-14 ENCOUNTER — Ambulatory Visit: Payer: Self-pay | Admitting: Family Medicine

## 2023-08-24 ENCOUNTER — Other Ambulatory Visit: Payer: Self-pay | Admitting: Family Medicine

## 2023-08-24 DIAGNOSIS — K219 Gastro-esophageal reflux disease without esophagitis: Secondary | ICD-10-CM

## 2024-11-05 ENCOUNTER — Ambulatory Visit: Payer: Self-pay

## 2024-11-05 NOTE — Telephone Encounter (Signed)
 FYI Only or Action Required?: FYI only for provider: appointment scheduled on tomorrow morning.  Patient was last seen in primary care on 05/28/2023 by Jordan, Betty G, MD.  Called Nurse Triage reporting Difficulty swallowing//Throat feels tight  Symptoms began Quite a while ago - worsening.  Interventions attempted: Other: Called EMS was taking GERD meds.  Symptoms are: gradually worsening  Triage Disposition: See PCP Within 2 Weeks  Patient/caregiver understands and will follow disposition?: Yes                     Reason for Disposition  Swallowing difficulty is a chronic symptom (recurrent or ongoing AND present > 4 weeks)  Answer Assessment - Initial Assessment Questions 1. DESCRIPTION: Tell me more about this problem. Are you  having trouble swallowing liquids, solids, or both? Any trouble with swallowing saliva (spit)?     Pt states that she feels throat tightness 2. SEVERITY: How bad is the swallowing difficulty?  (Scale 1-10; or mild, moderate, severe)     Mild - 3. ONSET: When did the swallowing problems begin?      Over 1 year ago 4. CAUSE: What do you think is causing the problem?  (e.g., dry mouth, food or pill stuck in throat, mouth pain, sore throat, progression of disease process such as dementia or Parkinson's disease).      Diagnosed with GERD - but pt is not sure that is the problem 5. CHRONIC or RECURRENT: Is this a new problem for you?  If No, ask: How long have you had this problem? (e.g., days, weeks, months)      A long time 6. OTHER SYMPTOMS: Do you have any other symptoms? (e.g., chest pain, difficulty breathing, mouth sores, sore throat, swollen tongue, chest pain)     Difficulty swallowing  Protocols used: Swallowing Difficulty-A-AH

## 2024-11-06 ENCOUNTER — Ambulatory Visit: Admitting: Family Medicine

## 2024-11-12 ENCOUNTER — Encounter: Payer: Self-pay | Admitting: Internal Medicine

## 2024-11-12 ENCOUNTER — Ambulatory Visit: Admitting: Internal Medicine

## 2024-11-12 VITALS — BP 114/78 | HR 98 | Temp 98.7°F | Ht 68.0 in | Wt 204.6 lb

## 2024-11-12 DIAGNOSIS — K219 Gastro-esophageal reflux disease without esophagitis: Secondary | ICD-10-CM

## 2024-11-12 DIAGNOSIS — R1310 Dysphagia, unspecified: Secondary | ICD-10-CM

## 2024-11-12 MED ORDER — OMEPRAZOLE 40 MG PO CPDR: 40.0000 mg | DELAYED_RELEASE_CAPSULE | Freq: Every day | ORAL | 1 refills | Status: AC

## 2024-11-12 NOTE — Patient Instructions (Signed)
 Take acid blocker 30 - 60 minutes pre meal  for  6 week    Plan gi referral in interim  If not back with gi by then  then make fu appt with out team in person or virtual ok .SABRA

## 2024-11-12 NOTE — Progress Notes (Signed)
 Chief Complaint  Patient presents with   Gastroesophageal Reflux    Pt reports she has been having a sever acid reflux. Causes her to have trouble swallowing and breathing. Also has a lot of mucus. Pt states heels like the acid stay on midle upper quadrant. Going on for a while for 6 months plus. Taking tums.     HPI: Megan Rubio 28 y.o. come in for above symptoms.  States that she has had acid reflux for many years but not that bad and over the last few months has gotten worse she saw Dr. Jordan last year who gave her proton omeprazole  40 mg to take daily she took a few times a week and it helped some and then as needed but has been off of this will take Tums as needed.  Over the last 2 months she has had some difficulty swallowing and some postprandial pain in the high epigastric area without acute vomiting although feeling like she needs to positive waterbrash. More recently also she has had some postnasal drainage that is clear makes it a little more difficult.  No unintended weight loss.  No pill dysphagia but sometimes feels it is hard to swallow.  If food gets stuck it is higher up in her chest. No history of significant allergy.  ROS: See pertinent positives and negatives per HPI. No chornic cough other cp  ruq pain  pos some constipation bowel habit change  Neg td  [employer] 8+ hours  Periods reg ok   Past Medical History:  Diagnosis Date   Proteinuria 2001   by DS. f/u 24 hr urine WNL    Family History  Problem Relation Age of Onset   Hypertension Brother     Social History   Socioeconomic History   Marital status: Single    Spouse name: Not on file   Number of children: Not on file   Years of education: Not on file   Highest education level: 12th grade  Occupational History   Not on file  Tobacco Use   Smoking status: Former    Current packs/day: 0.25    Types: Cigars, Cigarettes   Smokeless tobacco: Never  Vaping Use   Vaping status:  Never Used  Substance and Sexual Activity   Alcohol use: Yes    Alcohol/week: 0.0 standard drinks of alcohol    Comment: occasional-1x every couple months   Drug use: No   Sexual activity: Yes    Birth control/protection: None  Other Topics Concern   Not on file  Social History Narrative   Parents are divorced   [employer]   Average grades    Father in [place]   Working food financial controller.     Neg tad.   Living with brother his girlfriend and 2 young children works weekends                  Social Drivers of Health   Tobacco Use: Medium Risk (11/12/2024)   Patient History    Smoking Tobacco Use: Former    Smokeless Tobacco Use: Never    Passive Exposure: Not on Actuary Strain: Low Risk (05/27/2023)   Overall Financial Resource Strain (CARDIA)    Difficulty of Paying Living Expenses: Not very hard  Food Insecurity: No Food Insecurity (05/27/2023)   Hunger Vital Sign    Worried About Running Out of Food in the Last Year: Never true  Ran Out of Food in the Last Year: Never true  Transportation Needs: No Transportation Needs (05/27/2023)   PRAPARE - Administrator, Civil Service (Medical): No    Lack of Transportation (Non-Medical): No  Physical Activity: Sufficiently Active (05/27/2023)   Exercise Vital Sign    Days of Exercise per Week: 5 days    Minutes of Exercise per Session: 90 min  Stress: No Stress Concern Present (05/27/2023)   [employer] of Occupational Health - Occupational Stress Questionnaire    Feeling of Stress : Not at all  Social Connections: Unknown (05/27/2023)   Social Connection and Isolation Panel    Frequency of Communication with Friends and Family: More than three times a week    Frequency of Social Gatherings with Friends and Family: More than three times a week    Attends Religious Services: More than 4 times per year    Active Member of Clubs or Organizations: Not on file    Attends Banker Meetings:  Not on file    Marital Status: Never married  Depression (PHQ2-9): Low Risk (11/12/2024)   Depression (PHQ2-9)    PHQ-2 Score: 1  Alcohol Screen: Low Risk (05/27/2023)   Alcohol Screen    Last Alcohol Screening Score (AUDIT): 3  Housing: Low Risk (05/27/2023)   Housing    Last Housing Risk Score: 0  Utilities: Not on file  Health Literacy: Not on file    Outpatient Medications Prior to Visit  Medication Sig Dispense Refill   acetaminophen  (TYLENOL ) 500 MG tablet Take 1,000 mg by mouth every 6 (six) hours as needed for moderate pain or headache.     omeprazole  (PRILOSEC) 40 MG capsule TAKE 1 CAPSULE(40 MG) BY MOUTH DAILY (Patient not taking: Reported on 11/12/2024) 60 capsule 0   No facility-administered medications prior to visit.     EXAM:  BP 114/78   Pulse 98   Temp 98.7 F (37.1 C) (Oral)   Ht 5' 8 (1.727 m)   Wt 204 lb 9.6 oz (92.8 kg)   LMP 11/07/2024 (Exact Date)   SpO2 96%   BMI 31.11 kg/m   Body mass index is 31.11 kg/m. Wt Readings from Last 3 Encounters:  11/12/24 204 lb 9.6 oz (92.8 kg)  05/28/23 200 lb 2 oz (90.8 kg)  01/28/23 190 lb (86.2 kg)    GENERAL: vitals reviewed and listed above, alert, oriented, appears well hydrated and in no acute distress HEENT: atraumatic, conjunctiva  clear, no obvious abnormalities on inspection of external nose and ears OP : no lesion edema or exudate  good airway  NECK: no obvious masses on inspection palpation  LUNGS: clear to auscultation bilaterally, no wheezes, rales or rhonchi, good air movement CV: HRRR, no clubbing cyanosis or  peripheral edema nl cap refill  Abdomen:  Sof,t normal bowel sounds without hepatosplenomegaly, no guarding rebound or masses no CVA tenderness  MS: moves all extremities without noticeable focal  abnormality Skin some tats  PSYCH: pleasant and cooperative, no obvious depression or anxiety negative screen  Lab Results  Component Value Date   WBC 8.7 01/28/2023   HGB 12.2 01/28/2023    HCT 36.8 01/28/2023   PLT 238 01/28/2023   GLUCOSE 94 01/28/2023   ALT 18 07/03/2021   AST 24 07/03/2021   NA 136 01/28/2023   K 3.4 (L) 01/28/2023   CL 108 01/28/2023   CREATININE 0.99 01/28/2023   BUN 12 01/28/2023   CO2 24 01/28/2023   TSH  0.813 01/28/2023   HGBA1C 5.7 09/06/2021   BP Readings from Last 3 Encounters:  11/12/24 114/78  05/28/23 120/70  01/28/23 (!) 120/94    ASSESSMENT AND PLAN:  Discussed the following assessment and plan:  Dysphagia, unspecified type - Plan: Ambulatory referral to Gastroenterology  Gastroesophageal reflux disease, unspecified whether esophagitis present - Plan: omeprazole  (PRILOSEC) 40 MG capsule, Ambulatory referral to Gastroenterology  Has not had a good trial of ppi  as taking as needed in past .  However  has been on for a long time .  And sx progressing?   So plan  Gi consult referral . Take ppi every day for 6 weeks and in interim gi consult. Fu appt at end of that time if not seen by gi team then.  I f needed can add on pepcid preferred over tums .   -Patient advised to return or notify health care team  if  new concerns arise.  Patient Instructions   Take acid blocker 30 - 60 minutes pre meal  for  6 week    Plan gi referral in interim  If not back with gi by then  then make fu appt with out team in person or virtual ok .SABRA     Brooks Stotz K. Elonzo Sopp M.D.

## 2024-12-08 ENCOUNTER — Encounter: Payer: Self-pay | Admitting: Gastroenterology

## 2024-12-31 NOTE — Progress Notes (Unsigned)
 "  Chief Complaint: GERD, dysphagia Primary GI Doctor:***  HPI:  Patient is a  29  year old female patient with past medical history of *****who was referred to me by Charlett Apolinar POUR, MD on 11/12/2024 for a evaluation of GERD, dysphagia.    Interval History  Patient admits/denies GERD Omeprazole  40 mg p.o. daily Patient admits/denies dysphagia Patient admits/denies nausea, vomiting, or weight loss  Patient admits/denies altered bowel habits Patient admits/denies abdominal pain Patient admits/denies rectal bleeding   Denies/Admits alcohol Denies/Admits smoking Denies/Admits NSAID use. Denies/Admits they are on blood thinners.  Patients last colonoscopy Patients last EGD  Surgical history:  Patient's family history includes  Wt Readings from Last 3 Encounters:  11/12/24 204 lb 9.6 oz (92.8 kg)  05/28/23 200 lb 2 oz (90.8 kg)  01/28/23 190 lb (86.2 kg)      Past Medical History:  Diagnosis Date   Proteinuria 2001   by DS. f/u 24 hr urine WNL    No past surgical history on file.  Current Outpatient Medications  Medication Sig Dispense Refill   acetaminophen  (TYLENOL ) 500 MG tablet Take 1,000 mg by mouth every 6 (six) hours as needed for moderate pain or headache.     omeprazole  (PRILOSEC) 40 MG capsule Take 1 capsule (40 mg total) by mouth daily. 90 capsule 1   No current facility-administered medications for this visit.    Allergies as of 01/01/2025 - Review Complete 11/12/2024  Allergen Reaction Noted   Sulfa antibiotics  04/02/2012    Family History  Problem Relation Age of Onset   Hypertension Brother     Review of Systems:    Constitutional: No weight loss, fever, chills, weakness or fatigue HEENT: Eyes: No change in vision               Ears, Nose, Throat:  No change in hearing or congestion Skin: No rash or itching Cardiovascular: No chest pain, chest pressure or palpitations   Respiratory: No SOB or cough Gastrointestinal: See HPI and  otherwise negative Genitourinary: No dysuria or change in urinary frequency Neurological: No headache, dizziness or syncope Musculoskeletal: No new muscle or joint pain Hematologic: No bleeding or bruising Psychiatric: No history of depression or anxiety    Physical Exam:  Vital signs: There were no vitals taken for this visit.  Constitutional:   Pleasant *** female/female appears to be in NAD, Well developed, Well nourished, alert and cooperative Eyes:   PEERL, EOMI. No icterus. Conjunctiva pink. Neck:  Supple Throat: Oral cavity and pharynx without inflammation, swelling or lesion.  Respiratory: Respirations even and unlabored. Lungs clear to auscultation bilaterally.   No wheezes, crackles, or rhonchi.  Cardiovascular: Normal S1, S2. Regular rate and rhythm. No peripheral edema, cyanosis or pallor.  Gastrointestinal:  Soft, nondistended, nontender. No rebound or guarding. Normal bowel sounds. No appreciable masses or hepatomegaly. Rectal:  Not performed.  Anoscopy: Msk:  Symmetrical without gross deformities. Without edema, no deformity or joint abnormality.  Neurologic:  Alert and  oriented x4;  grossly normal neurologically.  Skin:   Dry and intact without significant lesions or rashes.  RELEVANT LABS AND IMAGING: CBC    Latest Ref Rng & Units 01/28/2023    1:33 AM 07/03/2021    4:11 PM 03/11/2014    1:53 PM  CBC  WBC 4.0 - 10.5 K/uL 8.7  17.9  6.8   Hemoglobin 12.0 - 15.0 g/dL 87.7  87.3  88.5   Hematocrit 36.0 - 46.0 % 36.8  38.4  34.9   Platelets 150 - 400 K/uL 238  217.0  252.0      CMP     Latest Ref Rng & Units 01/28/2023    1:33 AM 09/06/2021   11:40 AM 07/03/2021    4:11 PM  CMP  Glucose 70 - 99 mg/dL 94  63  875   BUN 6 - 20 mg/dL 12  8  5    Creatinine 0.44 - 1.00 mg/dL 9.00  9.13  9.07   Sodium 135 - 145 mmol/L 136  138  132   Potassium 3.5 - 5.1 mmol/L 3.4  3.7  3.4   Chloride 98 - 111 mmol/L 108  106  101   CO2 22 - 32 mmol/L 24  25  20    Calcium 8.9 - 10.3  mg/dL 8.5  9.2  8.8   Total Protein 6.0 - 8.3 g/dL   7.2   Total Bilirubin 0.2 - 1.2 mg/dL   1.1   Alkaline Phos 39 - 117 U/L   79   AST 0 - 37 U/L   24   ALT 0 - 35 U/L   18      Lab Results  Component Value Date   TSH 0.813 01/28/2023     Assessment: 1. ***  Plan: 1. ***   Thank you for the courtesy of this consult. Please call me with any questions or concerns.   Kyliee Ortego, FNP-C [facility] 12/31/2024, 4:54 PM  Cc: Charlett Apolinar POUR, MD  "

## 2025-01-01 ENCOUNTER — Encounter: Payer: Self-pay | Admitting: Internal Medicine

## 2025-01-01 ENCOUNTER — Other Ambulatory Visit

## 2025-01-01 ENCOUNTER — Ambulatory Visit: Admitting: Gastroenterology

## 2025-01-01 ENCOUNTER — Ambulatory Visit: Payer: Self-pay | Admitting: Gastroenterology

## 2025-01-01 ENCOUNTER — Encounter: Payer: Self-pay | Admitting: Gastroenterology

## 2025-01-01 VITALS — BP 100/60 | HR 81 | Ht 68.0 in | Wt 186.1 lb

## 2025-01-01 DIAGNOSIS — R1319 Other dysphagia: Secondary | ICD-10-CM

## 2025-01-01 DIAGNOSIS — D509 Iron deficiency anemia, unspecified: Secondary | ICD-10-CM

## 2025-01-01 DIAGNOSIS — R194 Change in bowel habit: Secondary | ICD-10-CM

## 2025-01-01 DIAGNOSIS — R140 Abdominal distension (gaseous): Secondary | ICD-10-CM

## 2025-01-01 DIAGNOSIS — R634 Abnormal weight loss: Secondary | ICD-10-CM

## 2025-01-01 DIAGNOSIS — K219 Gastro-esophageal reflux disease without esophagitis: Secondary | ICD-10-CM

## 2025-01-01 LAB — TSH: TSH: 0.36 u[IU]/mL (ref 0.35–5.50)

## 2025-01-01 LAB — B12 AND FOLATE PANEL
Folate: 11.6 ng/mL
Vitamin B-12: 750 pg/mL (ref 211–911)

## 2025-01-01 LAB — IBC + FERRITIN
Ferritin: 56.3 ng/mL (ref 10.0–291.0)
Iron: 97 ug/dL (ref 42–145)
Saturation Ratios: 26.6 % (ref 20.0–50.0)
TIBC: 364 ug/dL (ref 250.0–450.0)
Transferrin: 260 mg/dL (ref 212.0–360.0)

## 2025-01-01 LAB — SEDIMENTATION RATE: Sed Rate: 12 mm/h (ref 0–20)

## 2025-01-01 NOTE — Patient Instructions (Addendum)
 Constipation, bloating Recommend high fiber diet  Drink plenty of fluids Use over the counter Miralax po daily   Walk after meals Avoid food triggers   Weight loss Recommend protein shakes  GERD Continue omeprazole  40 mg po daily Recommend GERD diet  Dysphagia Handout provided   Your provider has requested that you go to the basement level for lab work before leaving today. Press B on the elevator. The lab is located at the first door on the left as you exit the elevator.  Your provider has ordered Diatherix stool testing for you. You have received a kit from our office today containing all necessary supplies to complete this test. Please carefully read the stool collection instructions provided in the kit before opening the accompanying materials. In addition, be sure there is a label providing your full name and date of birth on the puritan opti-swab tube that is supplied in the kit (if you do not see a label with this information on your test tube, please make us  aware before test collection!). After completing the test, you should secure the purtian tube into the specimen biohazard bag. The [facility] E-Req sheet (including date and time of specimen collection) should be placed into the outside pocket of the specimen biohazard bag and returned to the [place] lab (basement floor of [facility]) within 3 days of collection. Please make sure to give the specimen to a staff member at the lab. DO NOT leave the specimen on the counter.   If the specimen date and time (can be found in the upper right boxed portion of the sheet) are not filled out on the E-Req sheet, the test will NOT be performed.   You have been scheduled for an endoscopy. Please follow written instructions given to you at your visit today.  If you use inhalers (even only as needed), please bring them with you on the day of your procedure.  If you take any of the following medications,  they will need to be adjusted prior to your procedure:   DO NOT TAKE 7 DAYS PRIOR TO TEST- Trulicity (dulaglutide) Ozempic, Wegovy (semaglutide) Mounjaro, Zepbound (tirzepatide) Bydureon Bcise (exanatide extended release)  DO NOT TAKE 1 DAY PRIOR TO YOUR TEST Rybelsus (semaglutide) Adlyxin (lixisenatide) Victoza (liraglutide) Byetta (exanatide) ___________________________________________________________________________  Due to recent changes in healthcare laws, you may see the results of your imaging and laboratory studies on MyChart before your provider has had a chance to review them.  We understand that in some cases there may be results that are confusing or concerning to you. Not all laboratory results come back in the same time frame and the provider may be waiting for multiple results in order to interpret others.  Please give us  48 hours in order for your provider to thoroughly review all the results before contacting the office for clarification of your results.   _______________________________________________________  If your blood pressure at your visit was 140/90 or greater, please contact your primary care physician to follow up on this.  _______________________________________________________  If you are age 61 or older, your body mass index should be between 23-30. Your Body mass index is 28.3 kg/m. If this is out of the aforementioned range listed, please consider follow up with your Primary Care Provider.  If you are age 87 or younger, your body mass index should be between 19-25. Your Body mass index is 28.3 kg/m. If this is out of the aformentioned range listed, please consider follow up  with your Primary Care Provider.   ________________________________________________________  The [facility] providers would like to encourage you to use MYCHART to communicate with providers for non-urgent requests or questions.  Due to long hold times on the telephone, sending your  provider a message by [facility] may be a faster and more efficient way to get a response.  Please allow 48 business hours for a response.  Please remember that this is for non-urgent requests.  _______________________________________________________  [facility] is using a team-based approach to care.  Your team is made up of your doctor and two to three APPS. Our APPS (Nurse Practitioners and Physician Assistants) work with your physician to ensure care continuity for you. They are fully qualified to address your health concerns and develop a treatment plan. They communicate directly with your gastroenterologist to care for you. Seeing the Advanced Practice Practitioners on your physician's team can help you by facilitating care more promptly, often allowing for earlier appointments, access to diagnostic testing, procedures, and other specialty referrals.   Thank you for trusting me with your gastrointestinal care. Deanna May, FNP-C

## 2025-01-06 ENCOUNTER — Encounter: Admitting: Internal Medicine
